# Patient Record
Sex: Female | Born: 1995 | Race: Black or African American | Hispanic: No | Marital: Single | Smoking: Never smoker
Health system: Southern US, Community
[De-identification: ages and names within clinical notes are randomized; demographics above are authoritative.]

## PROBLEM LIST (undated history)

## (undated) ENCOUNTER — Inpatient Hospital Stay (HOSPITAL_COMMUNITY): Payer: Self-pay

## (undated) DIAGNOSIS — N926 Irregular menstruation, unspecified: Secondary | ICD-10-CM

## (undated) DIAGNOSIS — F419 Anxiety disorder, unspecified: Secondary | ICD-10-CM

## (undated) DIAGNOSIS — T7840XA Allergy, unspecified, initial encounter: Secondary | ICD-10-CM

## (undated) DIAGNOSIS — J45909 Unspecified asthma, uncomplicated: Secondary | ICD-10-CM

## (undated) HISTORY — DX: Anxiety disorder, unspecified: F41.9

## (undated) HISTORY — DX: Allergy, unspecified, initial encounter: T78.40XA

## (undated) HISTORY — DX: Irregular menstruation, unspecified: N92.6

---
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
## 2017-03-30 IMAGING — CR DG CHEST 2V
2 series · 2 of 2 positions shown · non-contrast
Comparison: 02/08/2015

CLINICAL DATA: Chest pain vomiting

EXAM:
CHEST  2 VIEW

[chest pa]
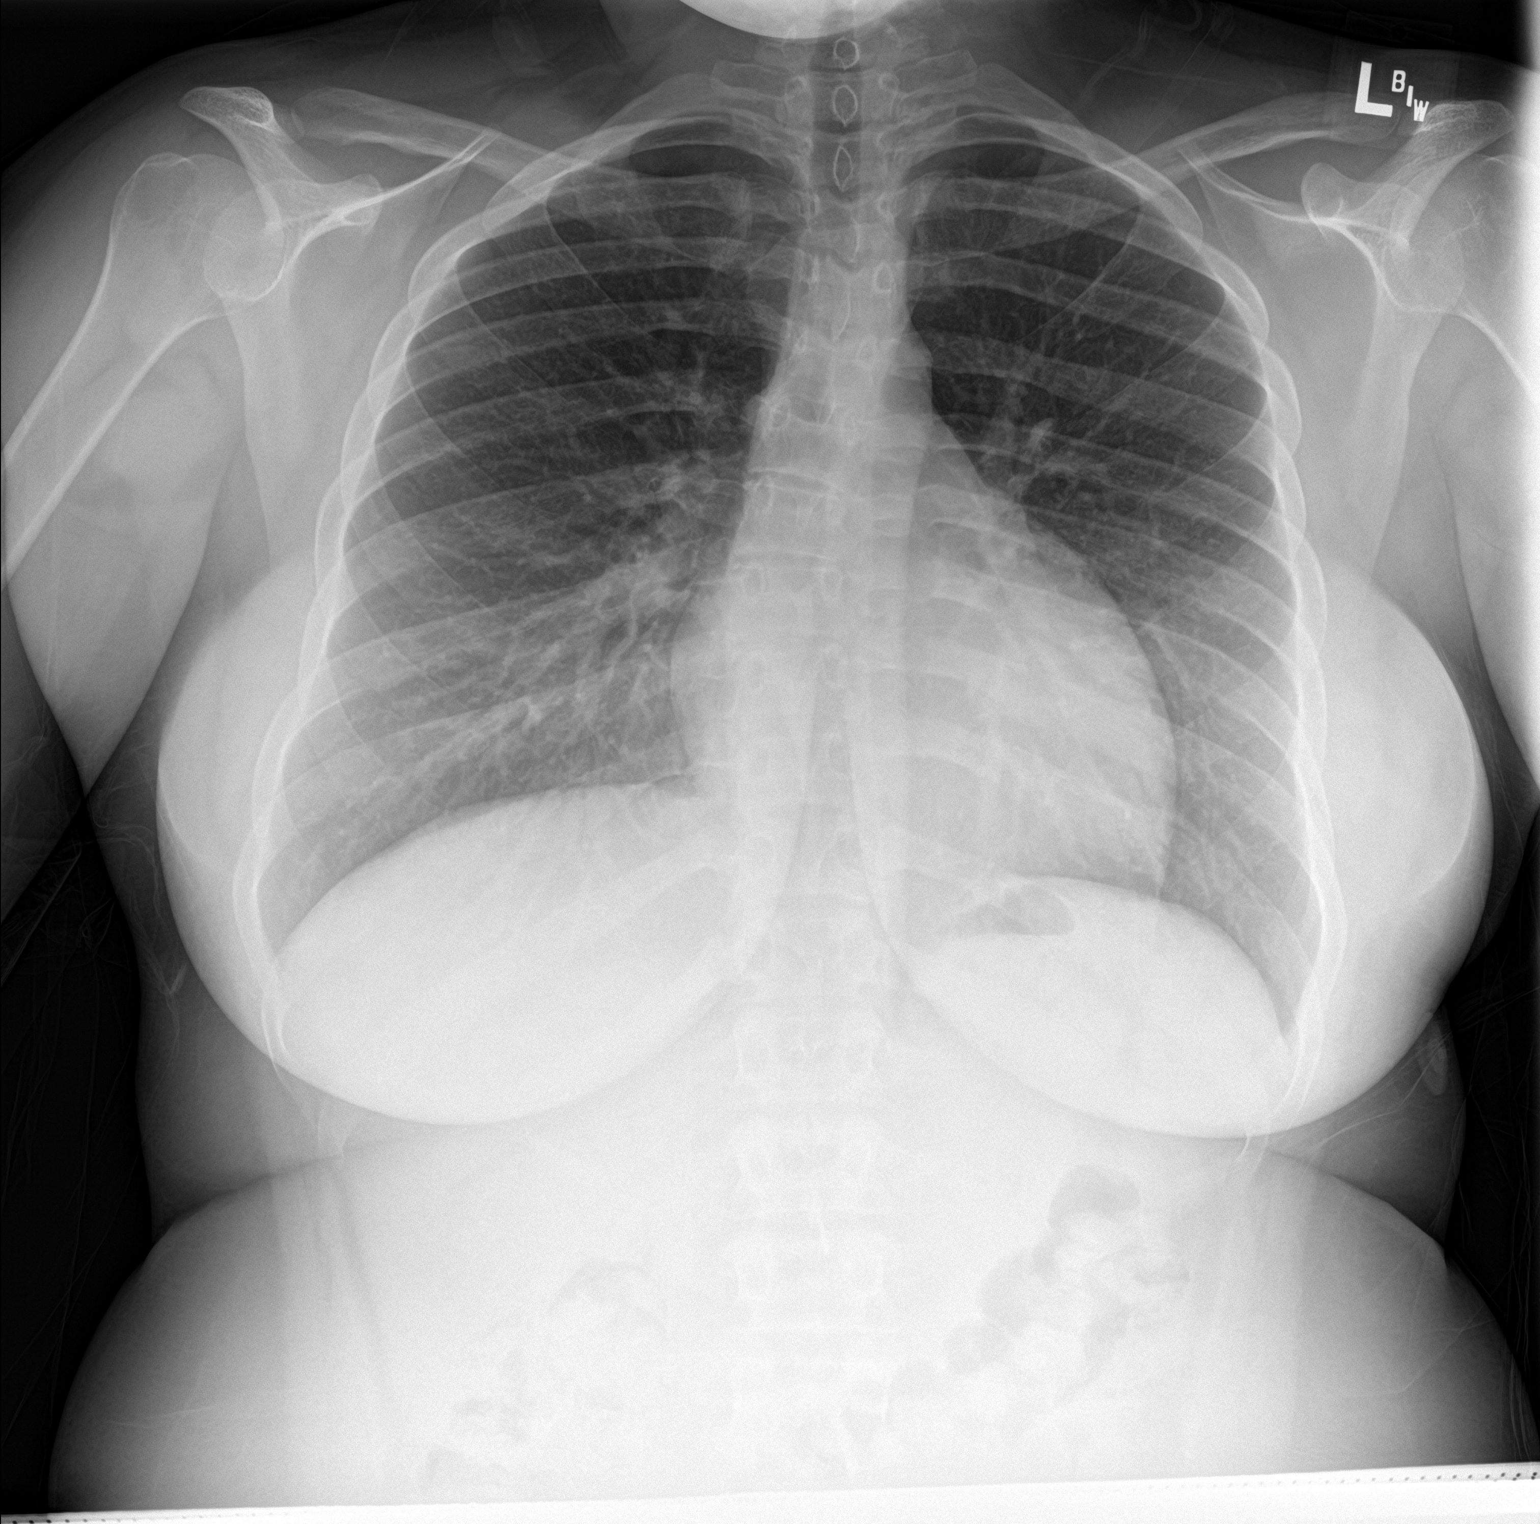

[chest lat]
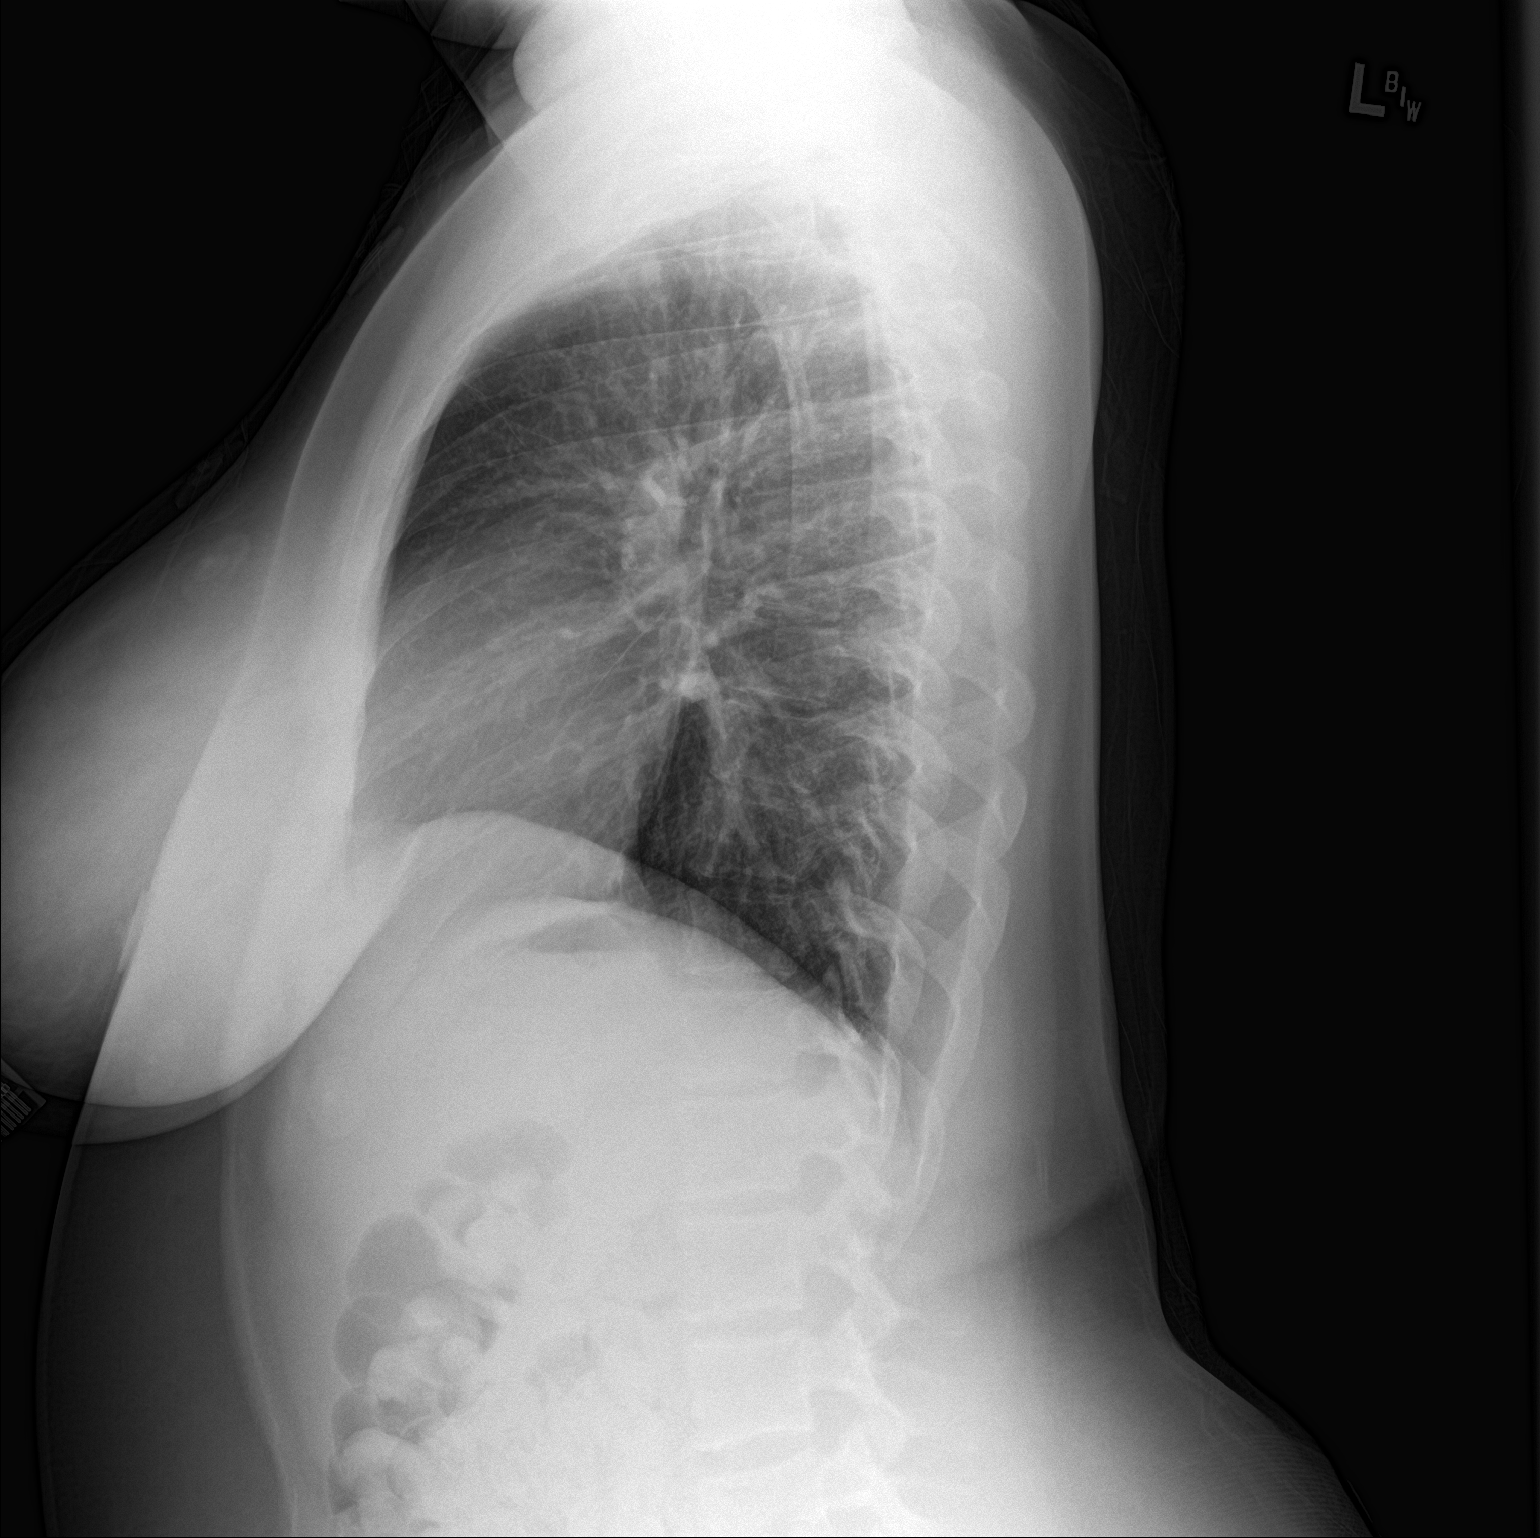

[2 of 2 positions shown; findings below may reference images not displayed]

FINDINGS: The heart size and mediastinal contours are within normal limits.
Both lungs are clear. The visualized skeletal structures are
unremarkable.
IMPRESSION: No active cardiopulmonary disease.

## 2017-10-15 ENCOUNTER — Emergency Department (HOSPITAL_COMMUNITY)
Admission: EM | Admit: 2017-10-15 | Discharge: 2017-10-15 | Disposition: A | Discharge status: Home / Self Care | Payer: Medicaid - Out of State | Attending: Emergency Medicine | Admitting: Emergency Medicine

## 2017-10-15 ENCOUNTER — Encounter (HOSPITAL_COMMUNITY): Payer: Self-pay | Admitting: Emergency Medicine

## 2017-10-15 ENCOUNTER — Other Ambulatory Visit: Payer: Self-pay

## 2017-10-15 DIAGNOSIS — R070 Pain in throat: Secondary | ICD-10-CM | POA: Insufficient documentation

## 2017-10-15 DIAGNOSIS — R0981 Nasal congestion: Secondary | ICD-10-CM | POA: Diagnosis not present

## 2017-10-15 DIAGNOSIS — J45909 Unspecified asthma, uncomplicated: Secondary | ICD-10-CM | POA: Diagnosis not present

## 2017-10-15 DIAGNOSIS — Z7722 Contact with and (suspected) exposure to environmental tobacco smoke (acute) (chronic): Secondary | ICD-10-CM | POA: Diagnosis not present

## 2017-10-15 DIAGNOSIS — R51 Headache: Secondary | ICD-10-CM | POA: Insufficient documentation

## 2017-10-15 DIAGNOSIS — Z79899 Other long term (current) drug therapy: Secondary | ICD-10-CM | POA: Insufficient documentation

## 2017-10-15 LAB — GROUP A STREP BY PCR: Group A Strep by PCR: NOT DETECTED

## 2017-10-15 MED ORDER — FLUTICASONE PROPIONATE 50 MCG/ACT NA SUSP: 1.0000 | Freq: Every day | NASAL | 0 refills | Status: DC

## 2017-10-15 MED ORDER — CETIRIZINE HCL 10 MG PO TABS: 10.0000 mg | ORAL_TABLET | Freq: Every day | ORAL | 0 refills | Status: DC

## 2017-10-15 NOTE — Discharge Instructions (Addendum)
You are seen in the emergency department today for congestion, sore throat, and cough.  Your strep test was negative.  This is likely due to allergies as discussed.  Please use Flonase, this is an intranasal steroid, daily.  Additionally please take Zyrtec daily as this is an allergy medicine.  We have prescribed you new medication(s) today. Discuss the medications prescribed today with your pharmacist as they can have adverse effects and interactions with your other medicines including over the counter and prescribed medications. Seek medical evaluation if you start to experience new or abnormal symptoms after taking one of these medicines, seek care immediately if you start to experience difficulty breathing, feeling of your throat closing, facial swelling, or rash as these could be indications of a more serious allergic reaction  Please stop using Afrin as this can actually resultantly make your congestion worse.  Follow-up with your primary care provider within 1 week for reevaluation.  Return to the ER for new or worsening symptoms or any other concerns that you may have.

## 2017-10-15 NOTE — ED Provider Notes (Signed)
Kellie Padilla [facility] EM[facility] Provider Note   CSN: [id number] Arrival date & time: 10/15/17  1048     History   Chief Complaint Chief Complaint  Patient presents with  . Facial Pain  . Sore Throat  . Nasal Congestion    HPI Kellie Padilla is a 22 y.o. female with a hx of asthma who presents to the ED with complaints of URI sxs x 1 week. Patient states she has had congestion, rhinorrhea, sinus pressure, bilateral ear pressure, and sore throat with swallowing (able to swallow). She has had minimal dry cough as well. She [place] and feels her allergies are flaring up. Tried Afrin nasal spray briefly without relief. No other specific alleviating/aggravating factors. Denies fever, chills, chest pain, wheezing, or dyspnea.   HPI  Past Medical History:  Diagnosis Date  . Asthma     There are no active problems to display for this patient.   History reviewed. No pertinent surgical history.   OB History   None      Home Medications    Prior to Admission medications   Medication Sig Start Date End Date Taking? Authorizing Provider  Acetaminophen-Pamabrom (MIDOL MAX ST TEEN FORMULA PO) Take 2 tablets by mouth daily as needed (for cramps).    [provider]  albuterol (PROVENTIL HFA;VENTOLIN HFA) 108 (90 BASE) MCG/ACT inhaler Inhale 1-2 puffs into the lungs every 6 (six) hours as needed for wheezing or shortness of breath. 02/08/15   Elson AreasSofia, Leslie K, PA-C  Aspirin-Salicylamide-Caffeine (BC HEADACHE POWDER PO) Take 1 Package by mouth daily as needed (for pain).    [provider]  benzonatate (TESSALON) 100 MG capsule Take 1 capsule (100 mg total) by mouth every 8 (eight) hours. 09/05/16   Janne NapoleonNeese, Hope M, NP  fluconazole (DIFLUCAN) 150 MG tablet Take 1 tablet (150 mg total) by mouth once. 12/28/12   Santiago GladLaisure, Heather, PA-C  ondansetron (ZOFRAN-ODT) 4 MG disintegrating tablet Take 1 tablet (4 mg total) by mouth every 6  (six) hours as needed for nausea or vomiting. 04/03/13   Lowanda FosterBrewer, Mindy, NP  pseudoephedrine (SUDAFED) 30 MG tablet Take 1 tablet (30 mg total) by mouth every 6 (six) hours as needed for congestion. 09/05/16   Janne NapoleonNeese, Hope M, NP  sodium chloride (OCEAN) 0.65 % SOLN nasal spray Place 1 spray into both nostrils as needed for congestion. 09/05/16   Janne NapoleonNeese, Hope M, NP    Family History No family history on file.  Social History Social History   Tobacco Use  . Smoking status: Passive Smoke Exposure - Never Smoker  . Smokeless tobacco: Never Used  Substance Use Topics  . Alcohol use: No  . Drug use: No     Allergies   Shellfish allergy   Review of Systems Review of Systems  Constitutional: Negative for chills and fever.  HENT: Positive for congestion, rhinorrhea, sinus pressure and sore throat. Negative for ear pain.   Respiratory: Positive for cough. Negative for shortness of breath and wheezing.   Cardiovascular: Negative for chest pain.  Gastrointestinal: Negative for abdominal pain.    Physical Exam Updated Vital Signs BP 118/75 (BP Location: Right Arm)   Pulse 78   Temp 98.1 F (36.7 C) (Oral)   Resp 16   Ht 4\' 11"  (1.499 m)   LMP 10/03/2017   SpO2 100%   BMI 32.32 kg/m   Physical Exam  Constitutional: She appears well-developed and well-nourished.  Non-toxic appearance. No distress.  HENT:  Head: Normocephalic and atraumatic.  Right Ear: Tympanic membrane, external ear and ear canal normal. No mastoid tenderness. Tympanic membrane is not perforated, not erythematous, not retracted and not bulging.  Left Ear: Tympanic membrane, external ear and ear canal normal. No mastoid tenderness. Tympanic membrane is not perforated, not erythematous, not retracted and not bulging.  Nose: Mucosal edema (somewhat boggy turbinates) present. Right sinus exhibits no maxillary sinus tenderness and no frontal sinus tenderness. Left sinus exhibits no maxillary sinus tenderness and no  frontal sinus tenderness.  Mouth/Throat: Uvula is midline. Posterior oropharyngeal erythema present. No oropharyngeal exudate.  Patient tolerating her own secretions without difficulty. No trismus. No drooling. No hot potato voice. Submandibular compartment is soft.   Eyes: Pupils are equal, round, and reactive to light. Conjunctivae are normal. Right eye exhibits no discharge. Left eye exhibits no discharge.  Neck: Normal range of motion. Neck supple. No neck rigidity.  Cardiovascular: Normal rate and regular rhythm.  No murmur heard. Pulmonary/Chest: Effort normal and breath sounds normal. No respiratory distress. She has no wheezes. She has no rhonchi. She has no rales.  Abdominal: Soft. She exhibits no distension. There is no tenderness.  Lymphadenopathy:    She has no cervical adenopathy.  Neurological: She is alert.  Skin: Skin is warm and dry. No rash noted.  Psychiatric: She has a normal mood and affect. Her behavior is normal.  Nursing note and vitals reviewed.    ED Treatments / Results  Labs Results for orders placed or performed during the hospital encounter of 10/15/17  Group A Strep by PCR  Result Value Ref Range   Group A Strep by PCR NOT DETECTED NOT DETECTED   No results found. EKG None  Radiology No results found.  Procedures Procedures (including critical care time)  Medications Ordered in ED Medications - No data to display   Initial Impression / Assessment and Plan / ED Course  I have reviewed the triage vital signs and the nursing notes.  Pertinent labs & imaging results that were available during my care of the patient were reviewed by me and considered in my medical decision making (see chart for details).   Patient presents to the ER with URI symptoms for the past 1 week.  Patient nontoxic-appearing, in no apparent distress, vitals WNL.  Patient's lungs are clear to auscultation bilaterally, no respiratory distress, afebrile, doubt pneumonia.  No  wheezing to raise concern for asthma exacerbation.  Patient is afebrile, no sinus tenderness, low suspicion for acute bacterial sinusitis.  Strep test negative.  No meningeal signs.  No evidence of acute AOM or EOM on exam.  Suspect viral versus allergic, will treat supportively with Flonase and Zyrtec. I discussed results, treatment plan, need for PCP follow-up, and return precautions with the patient. Provided opportunity for questions, patient confirmed understanding and is in agreement with plan.    Final Clinical Impressions(s) / ED Diagnoses   Final diagnoses:  Nasal congestion    ED Discharge Orders        Ordered    fluticasone (FLONASE) 50 MCG/ACT nasal spray  Daily     10/15/17 1304    cetirizine (ZYRTEC ALLERGY) 10 MG tablet  Daily     10/15/17 [address] Pleas Koch, PA-C 10/15/17 1715    Benjiman Core, MD 10/16/17 [phone]

## 2017-10-15 NOTE — ED Notes (Signed)
Pt states that she needs to leave because her ride is here.

## 2017-10-15 NOTE — ED Triage Notes (Signed)
Pt. Stated, Kellie Padilla had sinus problem and my throat hurts a little . This started a week ago.

## 2018-04-07 ENCOUNTER — Emergency Department (HOSPITAL_COMMUNITY): Payer: Medicaid - Out of State

## 2018-04-07 ENCOUNTER — Other Ambulatory Visit: Payer: Self-pay

## 2018-04-07 ENCOUNTER — Encounter (HOSPITAL_COMMUNITY): Payer: Self-pay | Admitting: Emergency Medicine

## 2018-04-07 ENCOUNTER — Emergency Department (HOSPITAL_COMMUNITY)
Admission: EM | Admit: 2018-04-07 | Discharge: 2018-04-07 | Disposition: A | Discharge status: Home / Self Care | Payer: Medicaid - Out of State | Attending: Emergency Medicine | Admitting: Emergency Medicine

## 2018-04-07 DIAGNOSIS — R0602 Shortness of breath: Secondary | ICD-10-CM | POA: Diagnosis present

## 2018-04-07 DIAGNOSIS — J45909 Unspecified asthma, uncomplicated: Secondary | ICD-10-CM | POA: Diagnosis not present

## 2018-04-07 DIAGNOSIS — Z79899 Other long term (current) drug therapy: Secondary | ICD-10-CM | POA: Diagnosis not present

## 2018-04-07 DIAGNOSIS — J9801 Acute bronchospasm: Secondary | ICD-10-CM | POA: Diagnosis not present

## 2018-04-07 DIAGNOSIS — Z7722 Contact with and (suspected) exposure to environmental tobacco smoke (acute) (chronic): Secondary | ICD-10-CM | POA: Insufficient documentation

## 2018-04-07 LAB — I-STAT BETA HCG BLOOD, ED (MC, WL, AP ONLY): I-stat hCG, quantitative: <5 m[IU]/mL (ref <5)

## 2018-04-07 LAB — CBC
HEMATOCRIT: 40.7 % (ref 36.0–46.0)
Hemoglobin: 12.6 g/dL (ref 12.0–15.0)
MCH: 27.3 pg (ref 26.0–34.0)
MCHC: 31 g/dL (ref 30.0–36.0)
MCV: 88.3 fL (ref 80.0–100.0)
Platelets: 264 10*3/uL (ref 150–400)
RBC: 4.61 MIL/uL (ref 3.87–5.11)
RDW: 14.2 % (ref 11.5–15.5)
WBC: 5.5 10*3/uL (ref 4.0–10.5)
nRBC: 0 % (ref 0.0–0.2)

## 2018-04-07 LAB — BASIC METABOLIC PANEL
Anion gap: 8 (ref 5–15)
BUN: 17 mg/dL (ref 6–20)
CO2: 24 mmol/L (ref 22–32)
Calcium: 9.1 mg/dL (ref 8.9–10.3)
Chloride: 107 mmol/L (ref 98–111)
Creatinine, Ser: 0.66 mg/dL (ref 0.44–1.00)
GFR calc Af Amer: >60 mL/min (ref >60)
GLUCOSE: 99 mg/dL (ref 70–99)
POTASSIUM: 3.6 mmol/L (ref 3.5–5.1)
Sodium: 139 mmol/L (ref 135–145)

## 2018-04-07 LAB — I-STAT TROPONIN, ED: TROPONIN I, POC: 0 ng/mL (ref 0.00–0.08)

## 2018-04-07 MED ORDER — ALBUTEROL SULFATE (2.5 MG/3ML) 0.083% IN NEBU: 5.0000 mg | INHALATION_SOLUTION | Freq: Once | RESPIRATORY_TRACT | Status: DC

## 2018-04-07 MED ORDER — AEROCHAMBER PLUS FLO-VU MISC
1.0000 | Freq: Once | Status: DC
  Filled 2018-04-07: qty 1

## 2018-04-07 MED ORDER — ALBUTEROL SULFATE HFA 108 (90 BASE) MCG/ACT IN AERS: 2.0000 | INHALATION_SPRAY | RESPIRATORY_TRACT | Status: DC | PRN

## 2018-04-07 MED ORDER — ALBUTEROL SULFATE HFA 108 (90 BASE) MCG/ACT IN AERS
2.0000 | INHALATION_SPRAY | RESPIRATORY_TRACT | Status: DC | PRN
  Filled 2018-04-07: qty 6.7

## 2018-04-07 MED ORDER — AEROCHAMBER Z-STAT PLUS/MEDIUM MISC
Status: AC
  Filled 2018-04-07: qty 1

## 2018-04-07 MED ORDER — IPRATROPIUM-ALBUTEROL 0.5-2.5 (3) MG/3ML IN SOLN
3.0000 mL | Freq: Once | RESPIRATORY_TRACT | Status: AC
  Administered 2018-04-07: 3 mL via RESPIRATORY_TRACT
  Filled 2018-04-07: qty 3

## 2018-04-07 NOTE — ED Triage Notes (Signed)
Patient complaining of sob and left chest pain. Patient states she woke up this morning and she was having problems with her breathing.

## 2018-04-07 NOTE — ED Provider Notes (Signed)
[facility]-EMERGENCY DEPT Provider Note: Lowella DellJ. Lane Trishia Cuthrell, MD, FACEP  CSN: [id number] MRN: [id number] ARRIVAL: 04/07/18 at 0248 ROOM: WA[facility]   CHIEF COMPLAINT  Shortness of Breath   HISTORY OF PRESENT ILLNESS  04/07/18 4:29 AM Kellie Padilla is a [age] y.o. female with a history of asthma.  She woke up this morning from sleep with coughing and chest tightness.  Symptoms are consistent with previous asthma attacks.  She has had this occur several days in a row.  She does not have an inhaler.  She was given a DuoNeb treatment prior to my evaluation with significant improvement in her symptoms.  She denies frank chest pain.   Past Medical History:  Diagnosis Date  . Asthma     History reviewed. No pertinent surgical history.  History reviewed. No pertinent family history.  Social History   Tobacco Use  . Smoking status: Passive Smoke Exposure - Never Smoker  . Smokeless tobacco: Never Used  Substance Use Topics  . Alcohol use: No  . Drug use: No    Prior to Admission medications   Medication Sig Start Date End Date Taking? Authorizing Provider  acetaminophen (TYLENOL) 325 MG tablet Take 650 mg by mouth every 6 (six) hours as needed for moderate pain.   Yes [provider]  cetirizine (ZYRTEC ALLERGY) 10 MG tablet Take 1 tablet (10 mg total) by mouth daily. Patient taking differently: Take 10 mg by mouth daily as needed for allergies.  10/15/17  Yes Petrucelli, Samantha R, PA-C  hydroxypropyl methylcellulose / hypromellose (ISOPTO TEARS / GONIOVISC) 2.5 % ophthalmic solution Place 1 drop into both eyes 3 (three) times daily as needed for dry eyes.   Yes [provider]  albuterol (PROVENTIL HFA;VENTOLIN HFA) 108 (90 BASE) MCG/ACT inhaler Inhale 1-2 puffs into the lungs every 6 (six) hours as needed for wheezing or shortness of breath. 02/08/15   Elson AreasSofia, Leslie K, PA-C  benzonatate (TESSALON) 100 MG capsule Take 1 capsule (100 mg total) by mouth every 8 (eight)  hours. Patient not taking: Reported on 04/07/2018 09/05/16   Janne NapoleonNeese, Hope M, NP  fluconazole (DIFLUCAN) 150 MG tablet Take 1 tablet (150 mg total) by mouth once. Patient not taking: Reported on 04/07/2018 12/28/12   Santiago GladLaisure, Heather, PA-C  fluticasone (FLONASE) 50 MCG/ACT nasal spray Place 1 spray into both nostrils daily. Patient not taking: Reported on 04/07/2018 10/15/17   Petrucelli, Lelon MastSamantha R, PA-C  ondansetron (ZOFRAN-ODT) 4 MG disintegrating tablet Take 1 tablet (4 mg total) by mouth every 6 (six) hours as needed for nausea or vomiting. Patient not taking: Reported on 04/07/2018 04/03/13   Lowanda FosterBrewer, Mindy, NP  pseudoephedrine (SUDAFED) 30 MG tablet Take 1 tablet (30 mg total) by mouth every 6 (six) hours as needed for congestion. Patient not taking: Reported on 04/07/2018 09/05/16   Janne NapoleonNeese, Hope M, NP  sodium chloride (OCEAN) 0.65 % SOLN nasal spray Place 1 spray into both nostrils as needed for congestion. Patient not taking: Reported on 04/07/2018 09/05/16   Janne NapoleonNeese, Hope M, NP    Allergies Dust mite extract; Pollen extract; and Shellfish allergy   REVIEW OF SYSTEMS  Negative except as noted here or in the History of Present Illness.   PHYSICAL EXAMINATION  Initial Vital Signs Blood pressure 118/76, pulse 75, temperature 98.4 F (36.9 C), temperature source Oral, resp. rate 19, height 4\' 11"  (1.499 m), weight 79.4 kg, last menstrual period 04/01/2018, SpO2 100 %.  Examination General: Well-developed, well-nourished female in no acute distress; appearance consistent with  age of record HENT: normocephalic; atraumatic Eyes: pupils equal, round and reactive to light; extraocular muscles intact Neck: supple Heart: regular rate and rhythm Lungs: clear to auscultation bilaterally except diminished sounds in right base Abdomen: soft; nondistended; nontender; no masses or hepatosplenomegaly; bowel sounds present Extremities: No deformity; full range of motion; pulses normal Neurologic:  Awake, alert and oriented; motor function intact in all extremities and symmetric; no facial droop Skin: Warm and dry Psychiatric: Normal mood and affect   RESULTS  Summary of this visit's results, reviewed by myself:   EKG Interpretation  Date/Time:  Monday April 07 2018 03:32:14 EST Ventricular Rate:  75 PR Interval:    QRS Duration: 98 QT Interval:  382 QTC Calculation: 427 R Axis:   50 Text Interpretation:  Sinus rhythm Normal ECG No significant change was found Confirmed by Paula Libra ([id number]) on 04/07/2018 3:59:44 AM      Laboratory Studies: Results for orders placed or performed during the hospital encounter of 04/07/18 (from the past 24 hour(s))  Basic metabolic panel     Status: None   Collection Time: 04/07/18  3:20 AM  Result Value Ref Range   Sodium 139 135 - 145 mmol/L   Potassium 3.6 3.5 - 5.1 mmol/L   Chloride 107 98 - 111 mmol/L   CO2 24 22 - 32 mmol/L   Glucose, Bld 99 70 - 99 mg/dL   BUN 17 6 - 20 mg/dL   Creatinine, Ser 6.21 0.44 - 1.00 mg/dL   Calcium 9.1 8.9 - 30.8 mg/dL   GFR calc non Af Amer >60 >60 mL/min   GFR calc Af Amer >60 >60 mL/min   Anion gap 8 5 - 15  CBC     Status: None   Collection Time: 04/07/18  3:20 AM  Result Value Ref Range   WBC 5.5 4.0 - 10.5 K/uL   RBC 4.61 3.87 - 5.11 MIL/uL   Hemoglobin 12.6 12.0 - 15.0 g/dL   HCT 65.7 84.6 - 96.2 %   MCV 88.3 80.0 - 100.0 fL   MCH 27.3 26.0 - 34.0 pg   MCHC 31.0 30.0 - 36.0 g/dL   RDW 95.2 84.1 - 32.4 %   Platelets 264 150 - 400 K/uL   nRBC 0.0 0.0 - 0.2 %  I-stat troponin, ED     Status: None   Collection Time: 04/07/18  3:47 AM  Result Value Ref Range   Troponin i, poc 0.00 0.00 - 0.08 ng/mL   Comment 3          I-Stat beta hCG blood, ED     Status: None   Collection Time: 04/07/18  3:48 AM  Result Value Ref Range   I-stat hCG, quantitative <5.0 <5 mIU/mL   Comment 3           Imaging Studies: Dg Chest 2 View  Result Date: 04/07/2018 CLINICAL DATA:  Shortness of  breath EXAM: CHEST - 2 VIEW COMPARISON:  09/05/2016 FINDINGS: The heart size and mediastinal contours are within normal limits. Both lungs are clear. The visualized skeletal structures are unremarkable. IMPRESSION: No active cardiopulmonary disease. Electronically Signed   By: Deatra Robinson M.D.   On: 04/07/2018 04:27    ED COURSE and MDM  Nursing notes and initial vitals signs, including pulse oximetry, reviewed.  Vitals:   04/07/18 0321  BP: 118/76  Pulse: 75  Resp: 19  Temp: 98.4 F (36.9 C)  TempSrc: Oral  SpO2: 100%  Weight: 79.4 kg  Height:  4\' 11"  (1.499 m)   Will provide patient with a new inhaler and AeroChamber.  PROCEDURES    ED DIAGNOSES     ICD-10-CM   1. Acute bronchospasm J98.01        Rushi Chasen, Jonny RuizJohn, MD 04/07/18 [phone]

## 2018-04-07 NOTE — ED Notes (Signed)
Pt is in no respiratory distress at this time.

## 2019-12-21 ENCOUNTER — Encounter (HOSPITAL_COMMUNITY): Payer: Self-pay | Admitting: Emergency Medicine

## 2019-12-21 ENCOUNTER — Ambulatory Visit (HOSPITAL_COMMUNITY)
Admission: EM | Admit: 2019-12-21 | Discharge: 2019-12-21 | Disposition: A | Discharge status: Home / Self Care | Payer: PRIVATE HEALTH INSURANCE | Attending: Family Medicine | Admitting: Family Medicine

## 2019-12-21 ENCOUNTER — Other Ambulatory Visit: Payer: Self-pay

## 2019-12-21 DIAGNOSIS — R519 Headache, unspecified: Secondary | ICD-10-CM | POA: Insufficient documentation

## 2019-12-21 DIAGNOSIS — R112 Nausea with vomiting, unspecified: Secondary | ICD-10-CM

## 2019-12-21 DIAGNOSIS — Z20822 Contact with and (suspected) exposure to covid-19: Secondary | ICD-10-CM | POA: Insufficient documentation

## 2019-12-21 DIAGNOSIS — Z7722 Contact with and (suspected) exposure to environmental tobacco smoke (acute) (chronic): Secondary | ICD-10-CM | POA: Insufficient documentation

## 2019-12-21 DIAGNOSIS — Z79899 Other long term (current) drug therapy: Secondary | ICD-10-CM | POA: Insufficient documentation

## 2019-12-21 LAB — SARS CORONAVIRUS 2 (TAT 6-24 HRS): SARS Coronavirus 2: NEGATIVE

## 2019-12-21 LAB — HCG, QUANTITATIVE, PREGNANCY: hCG, Beta Chain, Quant, S: <1 m[IU]/mL (ref <5)

## 2019-12-21 NOTE — ED Triage Notes (Signed)
Pt presents with vomiting and headache xs 3 days. Pt states she is late on her cycle and has taken two at home pregnancy test that were negative but would like blood test done.

## 2019-12-21 NOTE — Discharge Instructions (Addendum)
Covid swab pending.  I will call you with your blood test results.  Rest, hydrate  Follow up as needed for continued or worsening symptoms

## 2019-12-22 NOTE — ED Provider Notes (Signed)
[facility]    CSN: [id number] Arrival date & time: 12/21/19  1014      History   Chief Complaint Chief Complaint  Patient presents with   Emesis    HPI Kellie Padilla is a 24 y.o. female.   Pt is a 24 year old female that presents with vomiting and headache x 3 days. Pt reporting that she is late on her menstrual cycle and concerned for pregnancy. She has had 2 negative pregnancy tests. Vomiting mostly in the am and after eating. Gagging with brushing teeth. Patient's last menstrual period was 11/19/2019. No abdominal pain, fever, diarrhea. No respiratory symptoms. No dizziness, blurred vision.      Past Medical History:  Diagnosis Date   Asthma     There are no problems to display for this patient.   History reviewed. No pertinent surgical history.  OB History   No obstetric history on file.      Home Medications    Prior to Admission medications   Medication Sig Start Date End Date Taking? Authorizing Provider  acetaminophen (TYLENOL) 325 MG tablet Take 650 mg by mouth every 6 (six) hours as needed for moderate pain.    [provider]  cetirizine (ZYRTEC ALLERGY) 10 MG tablet Take 1 tablet (10 mg total) by mouth daily. Patient taking differently: Take 10 mg by mouth daily as needed for allergies.  10/15/17   Petrucelli, Samantha R, PA-C  hydroxypropyl methylcellulose / hypromellose (ISOPTO TEARS / GONIOVISC) 2.5 % ophthalmic solution Place 1 drop into both eyes 3 (three) times daily as needed for dry eyes.    [provider]    Family History Family History  Problem Relation Age of Onset   Healthy Mother    Healthy Father     Social History Social History   Tobacco Use   Smoking status: Passive Smoke Exposure - Never Smoker   Smokeless tobacco: Never Used  Substance Use Topics   Alcohol use: No   Drug use: No     Allergies   Dust mite extract, Pollen extract, and Shellfish allergy   Review of Systems Review  of Systems   Physical Exam Triage Vital Signs ED Triage Vitals  Enc Vitals Group     BP 12/21/19 1240 114/69     Pulse Rate 12/21/19 1240 77     Resp 12/21/19 1240 19     Temp 12/21/19 1240 98.1 F (36.7 C)     Temp Source 12/21/19 1240 Oral     SpO2 12/21/19 1240 100 %     Weight --      Height --      Head Circumference --      Peak Flow --      Pain Score 12/21/19 1237 0     Pain Loc --      Pain Edu? --      Excl. in GC? --    No data found.  Updated Vital Signs BP 114/69 (BP Location: Left Arm)    Pulse 77    Temp 98.1 F (36.7 C) (Oral)    Resp 19    LMP 11/19/2019    SpO2 100%   Visual Acuity Right Eye Distance:   Left Eye Distance:   Bilateral Distance:    Right Eye Near:   Left Eye Near:    Bilateral Near:     Physical Exam Vitals and nursing note reviewed.  Constitutional:      General: She is not in  acute distress.    Appearance: Normal appearance. She is not ill-appearing, toxic-appearing or diaphoretic.  HENT:     Head: Normocephalic.     Nose: Nose normal.  Eyes:     Conjunctiva/sclera: Conjunctivae normal.  Pulmonary:     Effort: Pulmonary effort is normal.  Musculoskeletal:        General: Normal range of motion.     Cervical back: Normal range of motion.  Skin:    General: Skin is warm and dry.     Findings: No rash.  Neurological:     Mental Status: She is alert.  Psychiatric:        Mood and Affect: Mood normal.      UC Treatments / Results  Labs (all labs ordered are listed, but only abnormal results are displayed) Labs Reviewed  SARS CORONAVIRUS 2 (TAT 6-24 HRS)  HCG, QUANTITATIVE, PREGNANCY    EKG   Radiology No results found.  Procedures Procedures (including critical care time)  Medications Ordered in UC Medications - No data to display  Initial Impression / Assessment and Plan / UC Course  I have reviewed the triage vital signs and the nursing notes.  Pertinent labs & imaging results that were available  during my care of the patient were reviewed by me and considered in my medical decision making (see chart for details).     Nausea and vomiting Hcg drawn covid swab pending.  Rest, hydrate.  Follow up as needed for continued or worsening symptoms  Final Clinical Impressions(s) / UC Diagnoses   Final diagnoses:  Non-intractable vomiting with nausea, unspecified vomiting type     Discharge Instructions     Covid swab pending.  I will call you with your blood test results.  Rest, hydrate  Follow up as needed for continued or worsening symptoms      ED Prescriptions    None     PDMP not reviewed this encounter.   Dahlia Byes A, NP 12/22/19 1457

## 2020-06-21 ENCOUNTER — Emergency Department (HOSPITAL_COMMUNITY)
Admission: EM | Admit: 2020-06-21 | Discharge: 2020-06-22 | Disposition: A | Discharge status: Home / Self Care | Payer: 59 | Attending: Emergency Medicine | Admitting: Emergency Medicine

## 2020-06-21 ENCOUNTER — Encounter (HOSPITAL_COMMUNITY): Payer: Self-pay

## 2020-06-21 DIAGNOSIS — N926 Irregular menstruation, unspecified: Secondary | ICD-10-CM | POA: Insufficient documentation

## 2020-06-21 DIAGNOSIS — J4521 Mild intermittent asthma with (acute) exacerbation: Secondary | ICD-10-CM | POA: Insufficient documentation

## 2020-06-21 DIAGNOSIS — R1110 Vomiting, unspecified: Secondary | ICD-10-CM | POA: Diagnosis not present

## 2020-06-21 DIAGNOSIS — Z7722 Contact with and (suspected) exposure to environmental tobacco smoke (acute) (chronic): Secondary | ICD-10-CM | POA: Insufficient documentation

## 2020-06-21 DIAGNOSIS — R0602 Shortness of breath: Secondary | ICD-10-CM | POA: Diagnosis present

## 2020-06-21 LAB — CBC
HCT: 40.3 % (ref 36.0–46.0)
Hemoglobin: 12.8 g/dL (ref 12.0–15.0)
MCH: 28.8 pg (ref 26.0–34.0)
MCHC: 31.8 g/dL (ref 30.0–36.0)
MCV: 90.6 fL (ref 80.0–100.0)
Platelets: 243 10*3/uL (ref 150–400)
RBC: 4.45 MIL/uL (ref 3.87–5.11)
RDW: 13.2 % (ref 11.5–15.5)
WBC: 5.5 10*3/uL (ref 4.0–10.5)
nRBC: 0 % (ref 0.0–0.2)

## 2020-06-21 LAB — I-STAT BETA HCG BLOOD, ED (MC, WL, AP ONLY): I-stat hCG, quantitative: <5 m[IU]/mL (ref <5)

## 2020-06-21 MED ORDER — ALBUTEROL SULFATE HFA 108 (90 BASE) MCG/ACT IN AERS
2.0000 | INHALATION_SPRAY | RESPIRATORY_TRACT | Status: DC | PRN
  Filled 2020-06-21: qty 6.7

## 2020-06-21 NOTE — ED Triage Notes (Signed)
Pt reports starting two nights she notice she's been gasping for air out of her sleep and does not have an inhaler nor an PCP and wants to be evaluated due to her hx of Asthma But denies any SOB at this time but only during exertion and Pt reports 1 episode  vomited today, Pt states that a trigger for asthma. Pt also reports she has not gotten her period and is late 12 days and wants to be evaluated

## 2020-06-22 LAB — COMPREHENSIVE METABOLIC PANEL WITH GFR
ALT: 19 U/L (ref 0–44)
AST: 20 U/L (ref 15–41)
Albumin: 3.9 g/dL (ref 3.5–5.0)
Alkaline Phosphatase: 47 U/L (ref 38–126)
Anion gap: 7 (ref 5–15)
BUN: 18 mg/dL (ref 6–20)
CO2: 23 mmol/L (ref 22–32)
Calcium: 9 mg/dL (ref 8.9–10.3)
Chloride: 106 mmol/L (ref 98–111)
Creatinine, Ser: 0.63 mg/dL (ref 0.44–1.00)
GFR, Estimated: >60 mL/min
Glucose, Bld: 101 mg/dL — ABNORMAL HIGH (ref 70–99)
Potassium: 3.5 mmol/L (ref 3.5–5.1)
Sodium: 136 mmol/L (ref 135–145)
Total Bilirubin: 0.6 mg/dL (ref 0.3–1.2)
Total Protein: 7.2 g/dL (ref 6.5–8.1)

## 2020-06-22 MED ORDER — AEROCHAMBER PLUS FLO-VU MISC: 2 refills | Status: DC

## 2020-06-22 MED ORDER — ALBUTEROL SULFATE HFA 108 (90 BASE) MCG/ACT IN AERS: 2.0000 | INHALATION_SPRAY | RESPIRATORY_TRACT | 0 refills | Status: DC | PRN

## 2020-06-22 MED ORDER — ALBUTEROL SULFATE (2.5 MG/3ML) 0.083% IN NEBU: 2.5000 mg | INHALATION_SOLUTION | Freq: Four times a day (QID) | RESPIRATORY_TRACT | 12 refills | Status: DC | PRN

## 2020-06-22 NOTE — Discharge Instructions (Addendum)
1. Medications: albuterol - MDI and nebulizer to be used as needed for shortness of breath and wheezing, continue taking Zyrtec daily, usual home medications 2. Treatment: rest, drink plenty of fluids, begin OTC antihistamine (Zyrtec or Claritin)  3. Follow Up: Please followup with your primary doctor in 2-3 days for discussion of your diagnoses and further evaluation after today's visit; if you do not have a primary care doctor use the resource guide provided to find one; Please return to the ER for difficulty breathing, high fevers or worsening symptoms.

## 2020-06-22 NOTE — ED Provider Notes (Signed)
[facility] EMERGENCY DEPARTMENT Provider Note   CSN: [id number] Arrival date & time: 06/21/20  2244     History Chief Complaint  Patient presents with  . Asthma    Kellie Padilla is a 25 y.o. female with a hx of asthma presents to the Emergency Department complaining of intermittent wheezing and shortness of breath over the last week and a half.  Patient reports she has a history of asthma, was hospitalized as a child but never intubated.  She reports she moved recently from [place] and does not have any medications nor a primary care provider.  She reports several times this week she has found herself coughing, wheezing and today had 1 episode of posttussive emesis.  Patient reports this is normal during the spring when she has intermittent asthma flares.  Patient does report she is taking Zyrtec 1 times per day without significant relief.  She reports changing in the weather and pollen aggravate her symptoms.  Albuterol MDI normally alleviates her symptoms however she does not have one at this time.  Additionally, patient reports a history of irregular menses but states that her menstrual cycle is 12 days late.  She is concerned that she might be pregnant.  She is not taking an oral contraceptive.  She is sexually active with 1 female.  Denies abdominal pain, vaginal bleeding, vaginal discharge.  She has not taken a home pregnancy test.  The history is provided by the patient and medical records. No language interpreter was used.       Past Medical History:  Diagnosis Date  . Asthma     There are no problems to display for this patient.   History reviewed. No pertinent surgical history.   OB History   No obstetric history on file.     Family History  Problem Relation Age of Onset  . Healthy Mother   . Healthy Father     Social History   Tobacco Use  . Smoking status: Passive Smoke Exposure - Never Smoker  . Smokeless tobacco: Never Used  Substance  Use Topics  . Alcohol use: No  . Drug use: No    Home Medications Prior to Admission medications   Medication Sig Start Date End Date Taking? Authorizing Provider  albuterol (PROVENTIL) (2.5 MG/3ML) 0.083% nebulizer solution Take 3 mLs (2.5 mg total) by nebulization every 6 (six) hours as needed for wheezing or shortness of breath. 06/22/20  Yes Yanis Larin, Dahlia Client, PA-C  albuterol (VENTOLIN HFA) 108 (90 Base) MCG/ACT inhaler Inhale 2 puffs into the lungs every 2 (two) hours as needed for wheezing or shortness of breath (cough). 06/22/20  Yes Anajulia Leyendecker, Dahlia Client, PA-C  Spacer/Aero-Holding Chambers (AEROCHAMBER PLUS WITH MASK) inhaler Use as instructed 06/22/20  Yes Shambhavi Salley, Dahlia Client, PA-C  acetaminophen (TYLENOL) 325 MG tablet Take 650 mg by mouth every 6 (six) hours as needed for moderate pain.    [provider]  cetirizine (ZYRTEC ALLERGY) 10 MG tablet Take 1 tablet (10 mg total) by mouth daily. Patient taking differently: Take 10 mg by mouth daily as needed for allergies.  10/15/17   Petrucelli, Samantha R, PA-C  hydroxypropyl methylcellulose / hypromellose (ISOPTO TEARS / GONIOVISC) 2.5 % ophthalmic solution Place 1 drop into both eyes 3 (three) times daily as needed for dry eyes.    [provider]    Allergies    Dust mite extract, Pollen extract, and Shellfish allergy  Review of Systems   Review of Systems  Constitutional: Negative for  appetite change, diaphoresis, fatigue, fever and unexpected weight change.  HENT: Negative for mouth sores.   Eyes: Negative for visual disturbance.  Respiratory: Positive for cough, chest tightness, shortness of breath and wheezing.   Cardiovascular: Negative for chest pain.  Gastrointestinal: Positive for vomiting. Negative for abdominal pain, constipation, diarrhea and nausea.  Endocrine: Negative for polydipsia, polyphagia and polyuria.  Genitourinary: Positive for menstrual problem. Negative for dysuria, frequency, hematuria  and urgency.  Musculoskeletal: Negative for back pain and neck stiffness.  Skin: Negative for rash.  Allergic/Immunologic: Negative for immunocompromised state.  Neurological: Negative for syncope, light-headedness and headaches.  Hematological: Does not bruise/bleed easily.  Psychiatric/Behavioral: Negative for sleep disturbance. The patient is not nervous/anxious.     Physical Exam Updated Vital Signs BP 112/76 (BP Location: Left Arm)   Pulse 79   Temp 98.3 F (36.8 C) (Oral)   Resp (!) 22   Ht 4\' 11"  (1.499 m)   Wt 73.9 kg   SpO2 100%   BMI 32.92 kg/m   Physical Exam Vitals and nursing note reviewed.  Constitutional:      General: She is not in acute distress.    Appearance: She is not diaphoretic.  HENT:     Head: Normocephalic.  Eyes:     General: No scleral icterus.    Conjunctiva/sclera: Conjunctivae normal.  Cardiovascular:     Rate and Rhythm: Normal rate and regular rhythm.     Pulses: Normal pulses.          Radial pulses are 2+ on the right side and 2+ on the left side.  Pulmonary:     Effort: Pulmonary effort is normal. No tachypnea, accessory muscle usage, prolonged expiration, respiratory distress or retractions.     Breath sounds: Normal breath sounds. No stridor. No wheezing or rhonchi.     Comments: Equal chest rise. No increased work of breathing. Clear and equal breath sounds Abdominal:     General: There is no distension.     Palpations: Abdomen is soft.     Tenderness: There is no abdominal tenderness. There is no guarding or rebound.  Musculoskeletal:     Cervical back: Normal range of motion.     Comments: Moves all extremities equally and without difficulty.  Skin:    General: Skin is warm and dry.     Capillary Refill: Capillary refill takes less than 2 seconds.  Neurological:     Mental Status: She is alert.     GCS: GCS eye subscore is 4. GCS verbal subscore is 5. GCS motor subscore is 6.     Comments: Speech is clear and goal  oriented.  Psychiatric:        Mood and Affect: Mood normal.     ED Results / Procedures / Treatments   Labs (all labs ordered are listed, but only abnormal results are displayed) Labs Reviewed  COMPREHENSIVE METABOLIC PANEL - Abnormal; Notable for the following components:      Result Value   Glucose, Bld 101 (*)    All other components within normal limits  CBC  I-STAT BETA HCG BLOOD, ED ([facility], [facility], [facility] ONLY)    Procedures Procedures   Medications Ordered in ED Medications  albuterol (VENTOLIN HFA) 108 (90 Base) MCG/ACT inhaler 2 puff (has no administration in time range)    ED Course  I have reviewed the triage vital signs and the nursing notes.  Pertinent labs & imaging results that were available during my care of the patient were  reviewed by me and considered in my medical decision making (see chart for details).    MDM Rules/Calculators/A&P                           Patient presents with several complaints.  Initially she complains of intermittent asthma exacerbations.  On exam she is not short of breath.  She has no hypoxia and does not have any wheezing.  Patient was given albuterol MDI here in the emergency department.  No evidence of acute asthma exacerbation.  Will not give prednisone at this time however instructed patient to utilize her albuterol MDI as needed.  Refills for MDI and nebulizer given.  Also encouraged her to continue taking her Zyrtec as directed.  No signs of sepsis or infection.  No focal breath sounds.  Highly doubt pneumonia or PE.  Lab work today is reassuring.  No evidence of anemia or leukocytosis.  Pregnancy test is negative.  Encouraged patient to see OB/GYN or primary care for further evaluation of her irregular menses.   Final Clinical Impression(s) / ED Diagnoses Final diagnoses:  Mild intermittent asthma with exacerbation  Irregular menses    Rx / DC Orders ED Discharge Orders         Ordered    albuterol (VENTOLIN HFA) 108 (90  Base) MCG/ACT inhaler  Every 2 hours PRN        06/22/20 0023    albuterol (PROVENTIL) (2.5 MG/3ML) 0.083% nebulizer solution  Every 6 hours PRN        06/22/20 0023    For home use only DME Nebulizer machine        06/22/20 0023    Spacer/Aero-Holding Chambers (AEROCHAMBER PLUS WITH MASK) inhaler        06/22/20 0023           Zaylia Riolo, Dahlia Client, PA-C 06/22/20 0029    Dione Booze, MD 06/22/20 [phone]

## 2020-06-22 NOTE — ED Notes (Signed)
Patient verbalized understanding of discharge instructions. Opportunity for questions and answers.  

## 2020-07-21 ENCOUNTER — Other Ambulatory Visit (HOSPITAL_COMMUNITY)
Admission: RE | Admit: 2020-07-21 | Discharge: 2020-07-21 | Disposition: A | Discharge status: Home / Self Care | Payer: 59 | Source: Ambulatory Visit | Attending: Advanced Practice Midwife | Admitting: Advanced Practice Midwife

## 2020-07-21 ENCOUNTER — Other Ambulatory Visit: Payer: Self-pay

## 2020-07-21 ENCOUNTER — Encounter: Payer: Self-pay | Admitting: Advanced Practice Midwife

## 2020-07-21 ENCOUNTER — Ambulatory Visit (INDEPENDENT_AMBULATORY_CARE_PROVIDER_SITE_OTHER): Payer: 59 | Admitting: Advanced Practice Midwife

## 2020-07-21 VITALS — BP 113/74 | HR 74 | Ht 60.0 in | Wt 162.0 lb

## 2020-07-21 DIAGNOSIS — Z113 Encounter for screening for infections with a predominantly sexual mode of transmission: Secondary | ICD-10-CM

## 2020-07-21 DIAGNOSIS — N939 Abnormal uterine and vaginal bleeding, unspecified: Secondary | ICD-10-CM | POA: Diagnosis not present

## 2020-07-21 DIAGNOSIS — Z3009 Encounter for other general counseling and advice on contraception: Secondary | ICD-10-CM

## 2020-07-21 DIAGNOSIS — Z01419 Encounter for gynecological examination (general) (routine) without abnormal findings: Secondary | ICD-10-CM | POA: Diagnosis not present

## 2020-07-21 DIAGNOSIS — R102 Pelvic and perineal pain: Secondary | ICD-10-CM

## 2020-07-21 MED ORDER — NORETHIN-ETH ESTRAD-FE BIPHAS 1 MG-10 MCG / 10 MCG PO TABS: 1.0000 | ORAL_TABLET | Freq: Every day | ORAL | 11 refills | Status: DC

## 2020-07-21 NOTE — Progress Notes (Signed)
Subjective:     Kellie Padilla is a 25 y.o. female here at [facility] for a routine exam.  Current complaints: irregular menses, longer cycles with heavier bleeding and cramping in between periods x 3-4 months.  She desires STD screening today.  Personal health questionnaire reviewed: yes.  Do you have a primary care provider? yes Do you feel safe at home? yes    Risk factors for chronic health problems: Smoking: Never Alchohol/how much: Did not assess Pt BMI: Body mass index is 31.64 kg/m.   Gynecologic History Patient's last menstrual period was 06/28/2020 (exact date). Contraception: none Last Pap: 2021. Results were: normal per pt Last mammogram: n/a.   Obstetric History OB History  No obstetric history on file.     The following portions of the patient's history were reviewed and updated as appropriate: allergies, current medications, past family history, past medical history, past social history, past surgical history and problem list.  Review of Systems Pertinent items noted in HPI and remainder of comprehensive ROS otherwise negative.    Objective:   BP 113/74   Pulse 74   Ht 5' (1.524 m)   Wt 162 lb (73.5 kg)   LMP 06/28/2020 (Exact Date)   BMI 31.64 kg/m  VS reviewed, nursing note reviewed,  Constitutional: well developed, well nourished, no distress HEENT: normocephalic CV: normal rate Pulm/chest wall: normal effort Breast Exam: Deferred with low risks and shared decision making, discussed recommendation to start mammogram between 40-50 yo/ exam Abdomen: soft Neuro: alert and oriented x 3 Skin: warm, dry Psych: affect normal Pelvic exam: SSE deferred Bimanual exam: Cervix 0/long/high, firm, anterior, neg CMT, uterus nontender, nonenlarged, adnexa without tenderness, enlargement, or mass       Assessment/Plan:   1. Screen for STD (sexually transmitted disease)  - Cervicovaginal ancillary only( [place]) - Hepatitis B surface antigen - Hepatitis  C antibody - HIV Antibody (routine testing w rflx) - RPR  2. Encounter for counseling regarding contraception --Discussed LARCs as most effective forms of birth control.  Discussed benefits/risks of other methods.  Pt desires OCPs. She was unhappy with contraceptive patch in the past with mood side effects and weight gain.  Will start with low dose OCPs, pt aware that may need to change if AUB not controlled on low dose.  Discussed failure rates of OCPs with regular use. Reviewed risk factors and s/sx of DVT/PE/reasons to seek medical care.   - Norethindrone-Ethinyl Estradiol-Fe Biphas (LO LOESTRIN FE) 1 MG-10 MCG / 10 MCG tablet; Take 1 tablet by mouth daily.  Dispense: 28 tablet; Refill: 11  3. Abnormal uterine bleeding (AUB) --Pt with abnormal bleeding since menarche, but usually menses every month, with 40-50 day cycles.  Recent weight loss, new job may contribute to longer cycles in last 4-5 months.  --Discussed treatment options with mainstay of treatment hormonal contraception.  Pt would like to try OCPs.   --F/U in 3 months  - Norethindrone-Ethinyl Estradiol-Fe Biphas (LO LOESTRIN FE) 1 MG-10 MCG / 10 MCG tablet; Take 1 tablet by mouth daily.  Dispense: 28 tablet; Refill: 11   4. Acute pelvic pain, female --Pt with LLQ pain, worse in between periods but occurs monthly x 4-5 months.  STD testing today.  Bimanual exam wnl.  Outpatient [place] ordered.  - US PELVIC COMPLETE WITH TRANSVAGINAL; Future  Follow up in: 3 months or as needed.   Sharen Counter, CNM 8:56 AM

## 2020-07-21 NOTE — Progress Notes (Signed)
New GYN patient presents for problem visit today.   c/o: Irregular Periods  LMP:06/28/20 lasted 5 days starts off light then heavy. States cycles have been late.associated w/ pain and cramps.  Pt took at home pregnancy test was Negative   Last Pap: 2021 in [place]  Contraception: None   Pt wants STD Testing and  vaginal swab and wants to test for everything. Pt made aware ins may not cover 100% . Pt elects to have testing done.

## 2020-07-21 NOTE — Patient Instructions (Signed)
Abnormal Uterine Bleeding  Abnormal uterine bleeding is unusual bleeding from the uterus. It includes bleeding after sex, or bleeding or spotting between menstrual periods. It may also include bleeding that is heavier than normal, menstrual periods that last longer than usual, or bleeding that occurs after menopause. Abnormal uterine bleeding can affect teenagers, women in their reproductive years, pregnant women, and women who have reached menopause. Common causes of abnormal uterine bleeding include:  Pregnancy.  Growths of tissue (polyps).  Benign tumors or growths in the uterus (fibroids). These are not cancer.  Infection.  Cancer.  Too much or too little of some hormones in the body (hormonal imbalances). Any type of abnormal bleeding should be checked by a health care provider. Many cases are minor and simple to treat, but others may be more serious. Treatment will depend on the cause and severity of the bleeding. Follow these instructions at home: Medicines  Take over-the-counter and prescription medicines only as told by your health care provider.  Tell your health care provider about other medicines that you take. You may be asked to stop taking aspirin or medicines that contain aspirin. These medicines can make bleeding worse.  If you were prescribed iron pills, take them as told by your health care provider. Iron pills help to replace iron that your body loses because of this condition. Managing constipation In cases of severe bleeding, you may be asked to increase your iron intake to treat anemia. This may cause constipation. To prevent or treat constipation, you may need to:  Drink enough fluid to keep your urine pale yellow.  Take over-the-counter or prescription medicines.  Eat foods that are high in fiber, such as beans, whole grains, and fresh fruits and vegetables.  Limit foods that are high in fat and processed sugars, such as fried or sweet foods. General  instructions  Monitor your condition for any changes.  Do not use tampons, douche, or have sex until your health care provider says these things are okay.  Change your pads often.  Get regular exams. This includes pelvic exams and cervical cancer screenings. ? It is up to you to get the results of any tests that are done. Ask your health care provider, or the department that is doing the tests, when your results will be ready.  Keep all follow-up visits as told by your health care provider. This is important. Contact a health care provider if you:  Have bleeding that lasts for more than 1 week.  Feel dizzy at times.  Feel nauseous or you vomit.  Feel light-headed or weak.  Notice any other changes that show that your condition is getting worse. Get help right away if you:  Pass out.  Have bleeding that soaks through a pad every hour.  Have pain in the abdomen.  Have a fever or chills.  Become sweaty or weak.  Pass large blood clots from your vagina. Summary  Abnormal uterine bleeding is unusual bleeding from the uterus.  Any type of abnormal bleeding should be evaluated by a health care provider. Many cases are minor and simple to treat, but others may be more serious.  Treatment will depend on the cause of the bleeding.  Get help right away if you pass out, you have bleeding that soaks through a pad every hour, or you pass large blood clots from your vagina. This information is not intended to replace advice given to you by your health care provider. Make sure you discuss any questions you   have with your health care provider. Document Revised: 12/02/2019 Document Reviewed: 01/27/2019 Elsevier Patient Education  2021 [employer]vier Inc.  

## 2020-07-22 LAB — HEPATITIS B SURFACE ANTIGEN: Hepatitis B Surface Ag: NEGATIVE

## 2020-07-22 LAB — HIV ANTIBODY (ROUTINE TESTING W REFLEX): HIV Screen 4th Generation wRfx: NONREACTIVE

## 2020-07-22 LAB — CERVICOVAGINAL ANCILLARY ONLY
Bacterial Vaginitis (gardnerella): POSITIVE — AB
Candida Glabrata: NEGATIVE
Candida Vaginitis: POSITIVE — AB
Chlamydia: NEGATIVE
Comment: NEGATIVE
Comment: NEGATIVE
Comment: NEGATIVE
Comment: NEGATIVE
Comment: NEGATIVE
Comment: NORMAL
Neisseria Gonorrhea: NEGATIVE
Trichomonas: NEGATIVE

## 2020-07-22 LAB — RPR: RPR Ser Ql: NONREACTIVE

## 2020-07-22 LAB — HEPATITIS C ANTIBODY: Hep C Virus Ab: <0.1 s/co ratio (ref 0.0–0.9)

## 2020-08-10 ENCOUNTER — Ambulatory Visit
Admission: RE | Admit: 2020-08-10 | Discharge: 2020-08-10 | Disposition: A | Discharge status: Home / Self Care | Payer: 59 | Source: Ambulatory Visit | Attending: Advanced Practice Midwife | Admitting: Advanced Practice Midwife

## 2020-08-10 ENCOUNTER — Other Ambulatory Visit: Payer: Self-pay

## 2020-08-10 DIAGNOSIS — R102 Pelvic and perineal pain unspecified side: Secondary | ICD-10-CM

## 2020-08-10 DIAGNOSIS — N939 Abnormal uterine and vaginal bleeding, unspecified: Secondary | ICD-10-CM

## 2020-10-31 ENCOUNTER — Encounter (HOSPITAL_COMMUNITY): Payer: Self-pay

## 2020-10-31 ENCOUNTER — Emergency Department (HOSPITAL_COMMUNITY)
Admission: EM | Admit: 2020-10-31 | Discharge: 2020-11-01 | Disposition: A | Discharge status: Home / Self Care | Payer: 59 | Attending: Emergency Medicine | Admitting: Emergency Medicine

## 2020-10-31 ENCOUNTER — Other Ambulatory Visit: Payer: Self-pay

## 2020-10-31 DIAGNOSIS — U071 COVID-19: Secondary | ICD-10-CM | POA: Diagnosis not present

## 2020-10-31 DIAGNOSIS — Z5321 Procedure and treatment not carried out due to patient leaving prior to being seen by health care provider: Secondary | ICD-10-CM | POA: Insufficient documentation

## 2020-10-31 DIAGNOSIS — R112 Nausea with vomiting, unspecified: Secondary | ICD-10-CM | POA: Diagnosis present

## 2020-10-31 LAB — COMPREHENSIVE METABOLIC PANEL
ALT: 16 U/L (ref 0–44)
AST: 19 U/L (ref 15–41)
Albumin: 4.8 g/dL (ref 3.5–5.0)
Alkaline Phosphatase: 48 U/L (ref 38–126)
Anion gap: 8 (ref 5–15)
BUN: 11 mg/dL (ref 6–20)
CO2: 23 mmol/L (ref 22–32)
Calcium: 9.1 mg/dL (ref 8.9–10.3)
Chloride: 105 mmol/L (ref 98–111)
Creatinine, Ser: 0.66 mg/dL (ref 0.44–1.00)
GFR, Estimated: >60 mL/min (ref >60)
Glucose, Bld: 95 mg/dL (ref 70–99)
Potassium: 3.7 mmol/L (ref 3.5–5.1)
Sodium: 136 mmol/L (ref 135–145)
Total Bilirubin: 0.8 mg/dL (ref 0.3–1.2)
Total Protein: 8.3 g/dL — ABNORMAL HIGH (ref 6.5–8.1)

## 2020-10-31 LAB — CBC
HCT: 46.3 % — ABNORMAL HIGH (ref 36.0–46.0)
Hemoglobin: 14.7 g/dL (ref 12.0–15.0)
MCH: 28.2 pg (ref 26.0–34.0)
MCHC: 31.7 g/dL (ref 30.0–36.0)
MCV: 88.7 fL (ref 80.0–100.0)
Platelets: 192 10*3/uL (ref 150–400)
RBC: 5.22 MIL/uL — ABNORMAL HIGH (ref 3.87–5.11)
RDW: 13.7 % (ref 11.5–15.5)
WBC: 5.6 10*3/uL (ref 4.0–10.5)
nRBC: 0 % (ref 0.0–0.2)

## 2020-10-31 LAB — I-STAT BETA HCG BLOOD, ED (MC, WL, AP ONLY): I-stat hCG, quantitative: <5 m[IU]/mL (ref <5)

## 2020-10-31 LAB — LIPASE, BLOOD: Lipase: 34 U/L (ref 11–51)

## 2020-10-31 NOTE — ED Provider Notes (Signed)
Emergency Medicine Provider Triage Evaluation Note  Kellie Padilla , a 25 y.o. female  was evaluated in triage.  Pt complains of nausea and vomiting since yesterday.LPM 7/18-7/21.   Review of Systems  Positive: nausea, vomiting, vaginal discharge Negative:abdominal pain, urinary symptoms    Physical Exam  BP 110/86   Pulse 98   Temp 99.1 F (37.3 C) (Oral)   Resp 18   SpO2 99%  Gen:   Awake, no distress   Resp:  Normal effort  MSK:   Moves extremities without difficulty  Other:  Actively vomtiing while in the Triage room  Medical Decision Making  Medically screening exam initiated at 5:52 PM.  Appropriate orders placed.  Kellie Padilla was informed that the remainder of the evaluation will be completed by another provider, this initial triage assessment does not replace that evaluation, and the importance of remaining in the ED until their evaluation is complete.  LPM 7/18  Also congested and with productive cough No covid vaccines.    Claude Manges, PA-C 10/31/20 1753    Tilden Fossa, MD 10/31/20 [phone]

## 2020-10-31 NOTE — ED Triage Notes (Signed)
Pt states she got overheated yesterday and has not been able to keep anything down. Pt c/o N/V.

## 2020-11-01 ENCOUNTER — Ambulatory Visit (INDEPENDENT_AMBULATORY_CARE_PROVIDER_SITE_OTHER)
Admission: RE | Admit: 2020-11-01 | Discharge: 2020-11-01 | Disposition: A | Discharge status: Home / Self Care | Payer: 59 | Source: Ambulatory Visit

## 2020-11-01 ENCOUNTER — Encounter (HOSPITAL_COMMUNITY): Payer: Self-pay

## 2020-11-01 VITALS — BP 109/69 | HR 97 | Temp 100.0°F | Resp 18

## 2020-11-01 DIAGNOSIS — R1030 Lower abdominal pain, unspecified: Secondary | ICD-10-CM

## 2020-11-01 DIAGNOSIS — N898 Other specified noninflammatory disorders of vagina: Secondary | ICD-10-CM | POA: Insufficient documentation

## 2020-11-01 DIAGNOSIS — Z113 Encounter for screening for infections with a predominantly sexual mode of transmission: Secondary | ICD-10-CM | POA: Insufficient documentation

## 2020-11-01 DIAGNOSIS — N3001 Acute cystitis with hematuria: Secondary | ICD-10-CM | POA: Insufficient documentation

## 2020-11-01 LAB — POCT URINALYSIS DIPSTICK, ED / UC
Glucose, UA: NEGATIVE mg/dL
Nitrite: NEGATIVE
Protein, ur: 100 mg/dL — AB
Specific Gravity, Urine: 1.025 (ref 1.005–1.030)
Urobilinogen, UA: >=8 mg/dL (ref 0.0–1.0)
pH: 6 (ref 5.0–8.0)

## 2020-11-01 LAB — POC URINE PREG, ED: Preg Test, Ur: NEGATIVE

## 2020-11-01 LAB — SARS CORONAVIRUS 2 (TAT 6-24 HRS): SARS Coronavirus 2: POSITIVE — AB

## 2020-11-01 MED ORDER — NITROFURANTOIN MONOHYD MACRO 100 MG PO CAPS: 100.0000 mg | ORAL_CAPSULE | Freq: Two times a day (BID) | ORAL | 0 refills | Status: AC

## 2020-11-01 NOTE — Discharge Instructions (Addendum)
You are being treated for urinary tract infection with Macrobid antibiotic.  Urine culture and STD vaginal swab are pending.  We will call if these are positive and treat as appropriate.  Please increase clear oral fluid intake.  Also continue to monitor fevers and treat as appropriate with Tylenol.  Please go to the hospital if any pelvic pain develops or if fever is consistently elevated.

## 2020-11-01 NOTE — ED Provider Notes (Signed)
[facility]    CSN: [id number] Arrival date & time: 11/01/20  1741      History   Chief Complaint Chief Complaint  Patient presents with   Vaginal Discharge    HPI Kellie Padilla is a 25 y.o. female.   Patient presents with 1 week history of vaginal irritation, yellow vaginal discharge with odor, lower abdominal pain.  Denies any urinary burning or frequency.  Denies any known exposure to STD but has had unprotected sexual intercourse recently.  Has also had a change in detergent for clothing.  Denies any pelvic pain but does have fever.  Last menstrual cycle was 10/24/2020.  Denies any vaginal lesions.  Did test positive for COVID-19 at ED last night.   Vaginal Discharge  Past Medical History:  Diagnosis Date   Asthma     There are no problems to display for this patient.   History reviewed. No pertinent surgical history.  OB History   No obstetric history on file.      Home Medications    Prior to Admission medications   Medication Sig Start Date End Date Taking? Authorizing Provider  nitrofurantoin, macrocrystal-monohydrate, (MACROBID) 100 MG capsule Take 1 capsule (100 mg total) by mouth 2 (two) times daily for 7 days. 11/01/20 11/08/20 Yes Lance Muss, FNP  acetaminophen (TYLENOL) 325 MG tablet Take 650 mg by mouth every 6 (six) hours as needed for moderate pain. Patient not taking: Reported on 07/21/2020    [provider]  albuterol (PROVENTIL) (2.5 MG/3ML) 0.083% nebulizer solution Take 3 mLs (2.5 mg total) by nebulization every 6 (six) hours as needed for wheezing or shortness of breath. 06/22/20   Muthersbaugh, Dahlia Client, PA-C  albuterol (VENTOLIN HFA) 108 (90 Base) MCG/ACT inhaler Inhale 2 puffs into the lungs every 2 (two) hours as needed for wheezing or shortness of breath (cough). 06/22/20   Muthersbaugh, Dahlia Client, PA-C  cetirizine (ZYRTEC ALLERGY) 10 MG tablet Take 1 tablet (10 mg total) by mouth daily. Patient taking differently: Take 10 mg  by mouth daily as needed for allergies. 10/15/17   Petrucelli, Samantha R, PA-C  hydroxypropyl methylcellulose / hypromellose (ISOPTO TEARS / GONIOVISC) 2.5 % ophthalmic solution Place 1 drop into both eyes 3 (three) times daily as needed for dry eyes. Patient not taking: Reported on 07/21/2020    [provider]  Norethindrone-Ethinyl Estradiol-Fe Biphas (LO LOESTRIN FE) 1 MG-10 MCG / 10 MCG tablet Take 1 tablet by mouth daily. 07/21/20   Leftwich-Kirby, Wilmer Floor, CNM  Spacer/Aero-Holding Chambers (AEROCHAMBER PLUS WITH MASK) inhaler Use as instructed 06/22/20   Muthersbaugh, Dahlia Client, PA-C    Family History Family History  Problem Relation Age of Onset   Healthy Mother    Healthy Father     Social History Social History   Tobacco Use   Smoking status: Passive Smoke Exposure - Never Smoker   Smokeless tobacco: Never  Vaping Use   Vaping Use: Never used  Substance Use Topics   Alcohol use: Yes    Comment: social    Drug use: No     Allergies   Dust mite extract, Pollen extract, and Shellfish allergy   Review of Systems Review of Systems Per HPI  Physical Exam Triage Vital Signs ED Triage Vitals  Enc Vitals Group     BP 11/01/20 1836 109/69     Pulse Rate 11/01/20 1836 97     Resp 11/01/20 1836 18     Temp 11/01/20 1836 100 F (37.8 C)  Temp Source 11/01/20 1836 Oral     SpO2 11/01/20 1836 97 %     Weight --      Height --      Head Circumference --      Peak Flow --      Pain Score 11/01/20 1834 7     Pain Loc --      Pain Edu? --      Excl. in GC? --    No data found.  Updated Vital Signs BP 109/69 (BP Location: Right Arm)   Pulse 97   Temp 100 F (37.8 C) (Oral)   Resp 18   LMP 10/24/2020   SpO2 97%   Visual Acuity Right Eye Distance:   Left Eye Distance:   Bilateral Distance:    Right Eye Near:   Left Eye Near:    Bilateral Near:     Physical Exam Constitutional:      Appearance: Normal appearance.  HENT:     Head: Normocephalic  and atraumatic.  Eyes:     Extraocular Movements: Extraocular movements intact.     Conjunctiva/sclera: Conjunctivae normal.  Pulmonary:     Effort: Pulmonary effort is normal.  Abdominal:     General: Abdomen is flat.     Tenderness: There is no abdominal tenderness.     Hernia: No hernia is present.  Genitourinary:    Comments: Deferred with shared decision making.  Self swab performed. Neurological:     General: No focal deficit present.     Mental Status: She is alert and oriented to person, place, and time. Mental status is at baseline.  Psychiatric:        Mood and Affect: Mood normal.        Behavior: Behavior normal.        Thought Content: Thought content normal.        Judgment: Judgment normal.     UC Treatments / Results  Labs (all labs ordered are listed, but only abnormal results are displayed) Labs Reviewed  POCT URINALYSIS DIPSTICK, ED / UC - Abnormal; Notable for the following components:      Result Value   Bilirubin Urine SMALL (*)    Ketones, ur TRACE (*)    Hgb urine dipstick TRACE (*)    Protein, ur 100 (*)    Leukocytes,Ua LARGE (*)    All other components within normal limits  URINE CULTURE  POC URINE PREG, ED  CERVICOVAGINAL ANCILLARY ONLY    EKG   Radiology No results found.  Procedures Procedures (including critical care time)  Medications Ordered in UC Medications - No data to display  Initial Impression / Assessment and Plan / UC Course  I have reviewed the triage vital signs and the nursing notes.  Pertinent labs & imaging results that were available during my care of the patient were reviewed by me and considered in my medical decision making (see chart for details).     Urinalysis showing large amount of leukocytes pending possible urinary tract infection.  Will treat with Macrobid x7 days.  Vaginal discharge could also be indicative of STD that is causing vaginal infection.  STD vaginal swab pending.  will change treatment  plan if appropriate once STD swab has resulted.  Urine culture is also pending.  Patient has fever but unsure of cause of fever due to patient having COVID-19.  Patient advised to monitor fever closely and to go to the hospital if pelvic pain develops or if fever is  consistently elevated. Discussed strict return precautions. Patient verbalized understanding and is agreeable with plan.  Final Clinical Impressions(s) / UC Diagnoses   Final diagnoses:  Acute cystitis with hematuria  Vaginal discharge  Screening for venereal disease  Lower abdominal pain     Discharge Instructions      You are being treated for urinary tract infection with Macrobid antibiotic.  Urine culture and STD vaginal swab are pending.  We will call if these are positive and treat as appropriate.  Please increase clear oral fluid intake.  Also continue to monitor fevers and treat as appropriate with Tylenol.  Please go to the hospital if any pelvic pain develops or if fever is consistently elevated.     ED Prescriptions     Medication Sig Dispense Auth. Provider   nitrofurantoin, macrocrystal-monohydrate, (MACROBID) 100 MG capsule Take 1 capsule (100 mg total) by mouth 2 (two) times daily for 7 days. 14 capsule Lance Muss, FNP      PDMP not reviewed this encounter.   Lance Muss, FNP 11/01/20 [phone]

## 2020-11-01 NOTE — ED Triage Notes (Signed)
Pt c/o vaginal irritation, discharge (yellow), odor, lower abd pain that started about a week ago (did change detergent recently). Pt states she was in ED last night and tested positive for covid.

## 2020-11-01 NOTE — ED Notes (Signed)
Called for room with no answer x2

## 2020-11-02 LAB — CERVICOVAGINAL ANCILLARY ONLY
Bacterial Vaginitis (gardnerella): POSITIVE — AB
Candida Glabrata: NEGATIVE
Candida Vaginitis: NEGATIVE
Chlamydia: NEGATIVE
Comment: NEGATIVE
Comment: NEGATIVE
Comment: NEGATIVE
Comment: NEGATIVE
Comment: NEGATIVE
Comment: NORMAL
Neisseria Gonorrhea: NEGATIVE
Trichomonas: POSITIVE — AB

## 2020-11-03 ENCOUNTER — Telehealth (HOSPITAL_COMMUNITY): Payer: Self-pay | Admitting: Emergency Medicine

## 2020-11-03 LAB — URINE CULTURE: Culture: 60000 — AB

## 2020-11-03 MED ORDER — METRONIDAZOLE 500 MG PO TABS: 500.0000 mg | ORAL_TABLET | Freq: Two times a day (BID) | ORAL | 0 refills | Status: DC

## 2021-01-06 ENCOUNTER — Ambulatory Visit (HOSPITAL_COMMUNITY): Payer: 59

## 2021-03-20 ENCOUNTER — Other Ambulatory Visit: Payer: Self-pay

## 2021-03-20 ENCOUNTER — Ambulatory Visit (HOSPITAL_COMMUNITY)
Admission: EM | Admit: 2021-03-20 | Discharge: 2021-03-20 | Disposition: A | Discharge status: Home / Self Care | Payer: 59 | Attending: Emergency Medicine | Admitting: Emergency Medicine

## 2021-03-20 ENCOUNTER — Encounter (HOSPITAL_COMMUNITY): Payer: Self-pay | Admitting: Emergency Medicine

## 2021-03-20 DIAGNOSIS — J4521 Mild intermittent asthma with (acute) exacerbation: Secondary | ICD-10-CM

## 2021-03-20 MED ORDER — PREDNISONE 10 MG (21) PO TBPK: ORAL_TABLET | ORAL | 0 refills | Status: DC

## 2021-03-20 MED ORDER — BENZONATATE 100 MG PO CAPS: 100.0000 mg | ORAL_CAPSULE | Freq: Three times a day (TID) | ORAL | 0 refills | Status: DC

## 2021-03-20 MED ORDER — ALBUTEROL SULFATE HFA 108 (90 BASE) MCG/ACT IN AERS: 2.0000 | INHALATION_SPRAY | RESPIRATORY_TRACT | 0 refills | Status: DC | PRN

## 2021-03-20 NOTE — ED Provider Notes (Signed)
[facility]    CSN: [id number] Arrival date & time: 03/20/21  1246      History   Chief Complaint Chief Complaint  Patient presents with   Cough    HPI Kellie Padilla is a 25 y.o. female.   Patient presents with concerns of persistent cough along with chest tightness and wheezing. She reports she had a cold at Thanksgiving and all her symptoms resolved except the cough. She has history of asthma but does not have an inhaler. She has had wheezing, dry cough worse at night, as well as chest tightness and sometimes discomfort with coughing. The patient denies fever, congestion, sore throat, headache, body aches, or n/v/d. She has been taking Mucinex with minimal improvement.   The history is provided by the patient.  Cough Associated symptoms: wheezing   Associated symptoms: no chest pain, no ear pain, no fever, no headaches, no myalgias, no rash, no shortness of breath and no sore throat    Past Medical History:  Diagnosis Date   Asthma     There are no problems to display for this patient.   History reviewed. No pertinent surgical history.  OB History   No obstetric history on file.      Home Medications    Prior to Admission medications   Medication Sig Start Date End Date Taking? Authorizing Provider  albuterol (VENTOLIN HFA) 108 (90 Base) MCG/ACT inhaler Inhale 2 puffs into the lungs every 4 (four) hours as needed for wheezing or shortness of breath. 03/20/21  Yes Jamarkis Branam L, PA  benzonatate (TESSALON) 100 MG capsule Take 1 capsule (100 mg total) by mouth every 8 (eight) hours. 03/20/21  Yes Tadan Shill L, PA  predniSONE (STERAPRED UNI-PAK 21 TAB) 10 MG (21) TBPK tablet Take as directed 03/20/21  Yes Kanyla Omeara L, PA  acetaminophen (TYLENOL) 325 MG tablet Take 650 mg by mouth every 6 (six) hours as needed for moderate pain. Patient not taking: Reported on 07/21/2020    [provider]  cetirizine (ZYRTEC ALLERGY) 10 MG tablet Take 1 tablet  (10 mg total) by mouth daily. Patient taking differently: Take 10 mg by mouth daily as needed for allergies. 10/15/17   Petrucelli, Samantha R, PA-C  hydroxypropyl methylcellulose / hypromellose (ISOPTO TEARS / GONIOVISC) 2.5 % ophthalmic solution Place 1 drop into both eyes 3 (three) times daily as needed for dry eyes. Patient not taking: Reported on 07/21/2020    [provider]  metroNIDAZOLE (FLAGYL) 500 MG tablet Take 1 tablet (500 mg total) by mouth 2 (two) times daily. 11/03/20   Lamptey, Britta Mccreedy, MD  Norethindrone-Ethinyl Estradiol-Fe Biphas (LO LOESTRIN FE) 1 MG-10 MCG / 10 MCG tablet Take 1 tablet by mouth daily. 07/21/20   Leftwich-Kirby, Wilmer Floor, CNM  Spacer/Aero-Holding Chambers (AEROCHAMBER PLUS WITH MASK) inhaler Use as instructed 06/22/20   Muthersbaugh, Dahlia Client, PA-C    Family History Family History  Problem Relation Age of Onset   Healthy Mother    Healthy Father     Social History Social History   Tobacco Use   Smoking status: Passive Smoke Exposure - Never Smoker   Smokeless tobacco: Never  Vaping Use   Vaping Use: Never used  Substance Use Topics   Alcohol use: Yes    Comment: social    Drug use: No     Allergies   Dust mite extract, Pollen extract, and Shellfish allergy   Review of Systems Review of Systems  Constitutional:  Negative for fatigue and  fever.  HENT:  Negative for congestion, ear pain and sore throat.   Respiratory:  Positive for cough, chest tightness and wheezing. Negative for shortness of breath.   Cardiovascular:  Negative for chest pain.  Gastrointestinal:  Negative for diarrhea, nausea and vomiting.  Musculoskeletal:  Negative for myalgias.  Skin:  Negative for rash.  Neurological:  Negative for dizziness and headaches.    Physical Exam Triage Vital Signs ED Triage Vitals  Enc Vitals Group     BP 03/20/21 1445 114/65     Pulse Rate 03/20/21 1445 63     Resp 03/20/21 1445 18     Temp 03/20/21 1445 97.6 F (36.4 C)      Temp Source 03/20/21 1445 Oral     SpO2 03/20/21 1445 99 %     Weight --      Height --      Head Circumference --      Peak Flow --      Pain Score 03/20/21 1444 8     Pain Loc --      Pain Edu? --      Excl. in GC? --    No data found.  Updated Vital Signs BP 114/65 (BP Location: Left Arm)   Pulse 63   Temp 97.6 F (36.4 C) (Oral)   Resp 18   LMP 02/20/2021   SpO2 99%   Visual Acuity Right Eye Distance:   Left Eye Distance:   Bilateral Distance:    Right Eye Near:   Left Eye Near:    Bilateral Near:     Physical Exam Vitals and nursing note reviewed.  Constitutional:      General: She is not in acute distress. HENT:     Head: Normocephalic.     Nose: Nose normal.     Mouth/Throat:     Mouth: Mucous membranes are moist.     Pharynx: Oropharynx is clear.  Eyes:     Conjunctiva/sclera: Conjunctivae normal.     Pupils: Pupils are equal, round, and reactive to light.  Cardiovascular:     Rate and Rhythm: Normal rate and regular rhythm.     Heart sounds: Normal heart sounds.  Pulmonary:     Effort: Pulmonary effort is normal. No respiratory distress.     Breath sounds: Normal breath sounds. No stridor. No wheezing (no current wheezing), rhonchi or rales.  Lymphadenopathy:     Cervical: No cervical adenopathy.  Skin:    Findings: No rash.  Neurological:     Mental Status: She is alert.     Gait: Gait normal.  Psychiatric:        Mood and Affect: Mood normal.     UC Treatments / Results  Labs (all labs ordered are listed, but only abnormal results are displayed) Labs Reviewed - No data to display  EKG   Radiology No results found.  Procedures Procedures (including critical care time)  Medications Ordered in UC Medications - No data to display  Initial Impression / Assessment and Plan / UC Course  I have reviewed the triage vital signs and the nursing notes.  Pertinent labs & imaging results that were available during my care of the patient  were reviewed by me and considered in my medical decision making (see chart for details).     S/s consistent with asthma exacerbation after recent URI. No evidence of active infection. Prednisone and albuterol for asthma flare, cough medicine. Follow-up and ER precautions discussed.   E/M: 1  chronic illness with exacerbation, no data, moderate risk due to prescription management  Final Clinical Impressions(s) / UC Diagnoses   Final diagnoses:  Mild intermittent asthma with exacerbation     Discharge Instructions      Take prednisone as prescribed and use inhaler as needed to help with wheezing and chest tightness. Take Tessalon as prescribed as needed for cough. Follow-up with PCP if no improvement in a week. Go to the ER if develop difficulty breathing not improved with inhaler.     ED Prescriptions     Medication Sig Dispense Auth. Provider   predniSONE (STERAPRED UNI-PAK 21 TAB) 10 MG (21) TBPK tablet Take as directed 1 each Vallery Sa, Keiva Dina L, PA   albuterol (VENTOLIN HFA) 108 (90 Base) MCG/ACT inhaler Inhale 2 puffs into the lungs every 4 (four) hours as needed for wheezing or shortness of breath. 1 each Vallery Sa, Katielynn Horan L, PA   benzonatate (TESSALON) 100 MG capsule Take 1 capsule (100 mg total) by mouth every 8 (eight) hours. 21 capsule Vallery Sa, Katerine Morua L, PA      PDMP not reviewed this encounter.   Estanislado Pandy, [place] 03/20/21 1513

## 2021-03-20 NOTE — ED Triage Notes (Signed)
Pt reports had cold at thanksgiving and cough will not go away. Taking Mucinex. Chest pains with coughing.  Hx asthma and reports needs an inhaler.

## 2021-03-20 NOTE — Discharge Instructions (Addendum)
Take prednisone as prescribed and use inhaler as needed to help with wheezing and chest tightness. Take Tessalon as prescribed as needed for cough. Follow-up with PCP if no improvement in a week. Go to the ER if develop difficulty breathing not improved with inhaler.

## 2021-07-24 NOTE — Progress Notes (Signed)
? ?New Patient Office Visit ? ?Subjective:  ?Patient ID: Kellie Padilla, female    DOB: July 04, 1995  Age: 26 y.o. MRN: [id number] ? ?CC:  ?Chief Complaint  ?Patient presents with  ? Establish Care  ?  Np. Est care. Pt c/o asthma symptoms and increase of anxiety. Pt also states of chronic constipation, one BM a month several months.   ? ? ?HPI ?Kellie Padilla presents for new patient visit to establish care.  Introduced to Designer, jewellery role and practice setting.  All questions answered.  Discussed provider/patient relationship and expectations. ? ?She has a history of anxiety. She states that she was on medication in the past. Overall, her anxiety is stable. She has not had any panic attacks recently.  ? ?She has a history of asthma and allergies. She has an allergy to dust and pollen and gets exposed to this at work. She is currently taking zyrtec daily as needed, she took it yesterday and today. She has an albuterol inhaler that she uses as needed. She has not needed to use her inhaler in the past week. The last time she needed it was at the beginning of the month when the pollen first started.  ? ?She endorses constipation. She usually has small bowel movements once a week. She endorses LLQ and bloating. She also endorses urinating frequently. She does pass gas. She has not tried anything over the counter. Because of her work schedule she tends to eat 1-2x a day and has snacks. She does endorses having to strain for bowel movements at times and has noted blood on the toilet tissue a few times.  ? ? ?  07/25/2021  ? 11:07 AM  ?Depression screen PHQ 2/9  ?Decreased Interest 1  ?Down, Depressed, Hopeless 0  ?PHQ - 2 Score 1  ?Altered sleeping 1  ?Tired, decreased energy 2  ?Change in appetite 1  ?Feeling bad or failure about yourself  1  ?Trouble concentrating 0  ?Moving slowly or fidgety/restless 0  ?Suicidal thoughts 0  ?PHQ-9 Score 6  ?Difficult doing work/chores Not difficult at all  ? ? ?  07/25/2021  ? 11:07 AM   ?GAD 7 : Generalized Anxiety Score  ?Nervous, Anxious, on Edge 1  ?Control/stop worrying 1  ?Restless 0  ?Easily annoyed or irritable 1  ?Afraid - awful might happen 1  ? ? ?Past Medical History:  ?Diagnosis Date  ? Asthma   ? ? ?History reviewed. No pertinent surgical history. ? ?Family History  ?Problem Relation Age of Onset  ? Healthy Mother   ? Healthy Father   ? ? ?Social History  ? ?Socioeconomic History  ? Marital status: Single  ?  Spouse name: Not on file  ? Number of children: Not on file  ? Years of education: Not on file  ? Highest education level: Not on file  ?Occupational History  ? Not on file  ?Tobacco Use  ? Smoking status: Never  ?  Passive exposure: Yes  ? Smokeless tobacco: Never  ?Vaping Use  ? Vaping Use: Never used  ?Substance and Sexual Activity  ? Alcohol use: Yes  ?  Comment: occasionally  ? Drug use: No  ? Sexual activity: Yes  ?  Partners: Male  ?  Birth control/protection: None  ?Other Topics Concern  ? Not on file  ?Social History Narrative  ? Not on file  ? ?Social Determinants of Health  ? ?Financial Resource Strain: Not on file  ?Food Insecurity: Not on file  ?  Transportation Needs: Not on file  ?Physical Activity: Not on file  ?Stress: Not on file  ?Social Connections: Not on file  ?Intimate Partner Violence: Not on file  ? ? ?ROS ?Review of Systems  ?Constitutional:  Positive for fatigue.  ?HENT: Negative.    ?Eyes:  Positive for itching. Negative for pain, redness and visual disturbance.  ?Respiratory: Negative.    ?Cardiovascular: Negative.   ?Gastrointestinal:  Positive for abdominal distention, abdominal pain (LLQ at times) and constipation. Negative for diarrhea and nausea.  ?Endocrine: Positive for polyuria. Negative for cold intolerance and heat intolerance.  ?Genitourinary:  Positive for frequency. Negative for dysuria.  ?Musculoskeletal: Negative.   ?Skin:   ?     Dry skin  ?Allergic/Immunologic: Positive for environmental allergies.  ?Neurological: Negative.    ?Psychiatric/Behavioral:  The patient is nervous/anxious (stable).   ? ?Objective:  ? ?Today's Vitals: BP 116/70 (BP Location: Left Arm, Patient Position: Sitting, Cuff Size: Normal)   Pulse 77   Temp (!) 97.5 ?F (36.4 ?C) (Temporal)   Ht 5' 0.75" (1.543 m)   Wt 172 lb 12.8 oz (78.4 kg)   LMP 06/13/2021 (Exact Date)   SpO2 100%   BMI 32.92 kg/m?  ? ?Physical Exam ?Vitals and nursing note reviewed.  ?Constitutional:   ?   General: She is not in acute distress. ?   Appearance: Normal appearance.  ?HENT:  ?   Head: Normocephalic and atraumatic.  ?   Right Ear: Tympanic membrane, ear canal and external ear normal.  ?   Left Ear: Tympanic membrane, ear canal and external ear normal.  ?Eyes:  ?   Conjunctiva/sclera: Conjunctivae normal.  ?Cardiovascular:  ?   Rate and Rhythm: Normal rate and regular rhythm.  ?   Pulses: Normal pulses.  ?   Heart sounds: Normal heart sounds.  ?Pulmonary:  ?   Effort: Pulmonary effort is normal.  ?   Breath sounds: Normal breath sounds.  ?Abdominal:  ?   General: Bowel sounds are normal. There is no distension.  ?   Palpations: Abdomen is soft.  ?   Tenderness: There is no abdominal tenderness. There is no guarding or rebound.  ?   Hernia: No hernia is present.  ?Musculoskeletal:     ?   General: Normal range of motion.  ?   Cervical back: Normal range of motion. No tenderness.  ?Lymphadenopathy:  ?   Cervical: No cervical adenopathy.  ?Skin: ?   General: Skin is warm and dry.  ?Neurological:  ?   General: No focal deficit present.  ?   Mental Status: She is alert and oriented to person, place, and time.  ?Psychiatric:     ?   Mood and Affect: Mood normal.     ?   Behavior: Behavior normal.     ?   Thought Content: Thought content normal.     ?   Judgment: Judgment normal.  ? ? ?Assessment & Plan:  ? ?Problem List Items Addressed This Visit   ?None ? ? ?Outpatient Encounter Medications as of 07/25/2021  ?Medication Sig  ? albuterol (VENTOLIN HFA) 108 (90 Base) MCG/ACT inhaler Inhale  2 puffs into the lungs every 4 (four) hours as needed for wheezing or shortness of breath.  ? cetirizine (ZYRTEC ALLERGY) 10 MG tablet Take 1 tablet (10 mg total) by mouth daily. (Patient taking differently: Take 10 mg by mouth daily as needed for allergies.)  ? [DISCONTINUED] acetaminophen (TYLENOL) 325 MG tablet Take 650 mg by  mouth every 6 (six) hours as needed for moderate pain. (Patient not taking: Reported on 07/21/2020)  ? [DISCONTINUED] benzonatate (TESSALON) 100 MG capsule Take 1 capsule (100 mg total) by mouth every 8 (eight) hours.  ? [DISCONTINUED] hydroxypropyl methylcellulose / hypromellose (ISOPTO TEARS / GONIOVISC) 2.5 % ophthalmic solution Place 1 drop into both eyes 3 (three) times daily as needed for dry eyes. (Patient not taking: Reported on 07/21/2020)  ? [DISCONTINUED] metroNIDAZOLE (FLAGYL) 500 MG tablet Take 1 tablet (500 mg total) by mouth 2 (two) times daily.  ? [DISCONTINUED] Norethindrone-Ethinyl Estradiol-Fe Biphas (LO LOESTRIN FE) 1 MG-10 MCG / 10 MCG tablet Take 1 tablet by mouth daily.  ? [DISCONTINUED] predniSONE (STERAPRED UNI-PAK 21 TAB) 10 MG (21) TBPK tablet Take as directed  ? [DISCONTINUED] Spacer/Aero-Holding Chambers (AEROCHAMBER PLUS WITH MASK) inhaler Use as instructed  ? ?No facility-administered encounter medications on file as of 07/25/2021.  ? ? ?Follow-up: No follow-ups on file.  ? ?Charyl Dancer, NP ? ?

## 2021-07-25 ENCOUNTER — Ambulatory Visit (INDEPENDENT_AMBULATORY_CARE_PROVIDER_SITE_OTHER): Payer: 59 | Admitting: Nurse Practitioner

## 2021-07-25 ENCOUNTER — Encounter: Payer: Self-pay | Admitting: Nurse Practitioner

## 2021-07-25 VITALS — BP 116/70 | HR 77 | Temp 97.5°F | Ht 60.75 in | Wt 172.8 lb

## 2021-07-25 DIAGNOSIS — Z13220 Encounter for screening for lipoid disorders: Secondary | ICD-10-CM

## 2021-07-25 DIAGNOSIS — K5900 Constipation, unspecified: Secondary | ICD-10-CM | POA: Diagnosis not present

## 2021-07-25 DIAGNOSIS — R350 Frequency of micturition: Secondary | ICD-10-CM

## 2021-07-25 DIAGNOSIS — Z136 Encounter for screening for cardiovascular disorders: Secondary | ICD-10-CM | POA: Diagnosis not present

## 2021-07-25 DIAGNOSIS — J4520 Mild intermittent asthma, uncomplicated: Secondary | ICD-10-CM | POA: Diagnosis not present

## 2021-07-25 DIAGNOSIS — F419 Anxiety disorder, unspecified: Secondary | ICD-10-CM

## 2021-07-25 LAB — COMPREHENSIVE METABOLIC PANEL
ALT: 11 U/L (ref 0–35)
AST: 15 U/L (ref 0–37)
Albumin: 4.5 g/dL (ref 3.5–5.2)
Alkaline Phosphatase: 46 U/L (ref 39–117)
BUN: 13 mg/dL (ref 6–23)
CO2: 27 mEq/L (ref 19–32)
Calcium: 9.3 mg/dL (ref 8.4–10.5)
Chloride: 103 mEq/L (ref 96–112)
Creatinine, Ser: 0.59 mg/dL (ref 0.40–1.20)
GFR: 126.83 mL/min (ref >60.00)
Glucose, Bld: 91 mg/dL (ref 70–99)
Potassium: 4 mEq/L (ref 3.5–5.1)
Sodium: 138 mEq/L (ref 135–145)
Total Bilirubin: 0.7 mg/dL (ref 0.2–1.2)
Total Protein: 7.6 g/dL (ref 6.0–8.3)

## 2021-07-25 LAB — CBC
HCT: 39.6 % (ref 36.0–46.0)
Hemoglobin: 13 g/dL (ref 12.0–15.0)
MCHC: 32.9 g/dL (ref 30.0–36.0)
MCV: 87.2 fl (ref 78.0–100.0)
Platelets: 197 10*3/uL (ref 150.0–400.0)
RBC: 4.54 Mil/uL (ref 3.87–5.11)
RDW: 13.9 % (ref 11.5–15.5)
WBC: 3.9 10*3/uL — ABNORMAL LOW (ref 4.0–10.5)

## 2021-07-25 LAB — LIPID PANEL
Cholesterol: 164 mg/dL (ref 0–200)
HDL: 66.1 mg/dL (ref >39.00)
LDL Cholesterol: 93 mg/dL (ref 0–99)
NonHDL: 97.69
Total CHOL/HDL Ratio: 2
Triglycerides: 25 mg/dL (ref 0.0–149.0)
VLDL: 5 mg/dL (ref 0.0–40.0)

## 2021-07-25 LAB — HEMOGLOBIN A1C: Hgb A1c MFr Bld: 5.8 % (ref 4.6–6.5)

## 2021-07-25 LAB — TSH: TSH: 1.45 u[IU]/mL (ref 0.35–5.50)

## 2021-07-25 MED ORDER — POLYETHYLENE GLYCOL 3350 17 GM/SCOOP PO POWD: 17.0000 g | Freq: Every day | ORAL | 1 refills | Status: DC

## 2021-07-25 MED ORDER — ALBUTEROL SULFATE HFA 108 (90 BASE) MCG/ACT IN AERS: 2.0000 | INHALATION_SPRAY | RESPIRATORY_TRACT | 3 refills | Status: DC | PRN

## 2021-07-25 MED ORDER — MONTELUKAST SODIUM 10 MG PO TABS: 10.0000 mg | ORAL_TABLET | Freq: Every day | ORAL | 1 refills | Status: DC

## 2021-07-25 NOTE — Assessment & Plan Note (Signed)
Chronic, stable.  She was on medication for this in the past, however is not currently taking any.  She states that overall her anxiety is well controlled right now.  Her PHQ-9 was a 6 and her GAD-7 is a 4.  Follow-up with any concerns or worsening symptoms. ?

## 2021-07-25 NOTE — Patient Instructions (Addendum)
It was great to see you! ? ?Start singluair (montelukast) once a day at bedtime for allergies. I have refilled your albuterol inhaler.  ? ?Start miralax once a day, you can mix this in water, juice, tea, coffee. You can also increase your water and fiber intake.  ? ?We are checking your labs today and will let you know the results via mychart/phone.  ? ?Let's follow-up in 3 months, sooner if you have concerns. ? ?If a referral was placed today, you will be contacted for an appointment. Please note that routine referrals can sometimes take up to 3-4 weeks to process. Please call our office if you haven't heard anything after this time frame. ? ?Take care, ? ?Rodman Pickle, NP ? ?

## 2021-07-25 NOTE — Assessment & Plan Note (Signed)
She has a history of chronic constipation, that is ongoing.  She states that she has bowel movements maybe once a week sometimes every other week.  She does endorse some bloating and left lower quadrant abdominal pain at times.  We will check CMP, CBC today.  Encouraged her to start drinking more fluids, increase amount of fiber in her diet, and start taking MiraLAX daily.  Follow-up in 3 months or sooner with more concerns ?

## 2021-07-25 NOTE — Assessment & Plan Note (Signed)
Chronic, stable.  She states that her symptoms are worse when the pollen is out, and with dust which she encounters at her job daily.  She has not had to use her albuterol inhaler at all this week, however she states that she still has some nasal congestion and rhinorrhea.  She has been taking Zyrtec daily, however this does not fully help her in her symptoms.  We will start montelukast 10 mg daily at bedtime.  Albuterol refill sent to her pharmacy.  Overall her symptoms are still intermittent, do not recommend a daily maintenance inhaler at this time. ?

## 2021-08-15 ENCOUNTER — Ambulatory Visit (INDEPENDENT_AMBULATORY_CARE_PROVIDER_SITE_OTHER): Payer: 59 | Admitting: Advanced Practice Midwife

## 2021-08-15 ENCOUNTER — Encounter: Payer: Self-pay | Admitting: Advanced Practice Midwife

## 2021-08-15 ENCOUNTER — Other Ambulatory Visit (HOSPITAL_COMMUNITY)
Admission: RE | Admit: 2021-08-15 | Discharge: 2021-08-15 | Disposition: A | Discharge status: Home / Self Care | Payer: 59 | Source: Ambulatory Visit | Attending: Advanced Practice Midwife | Admitting: Advanced Practice Midwife

## 2021-08-15 VITALS — BP 110/73 | HR 78 | Ht 60.0 in | Wt 176.4 lb

## 2021-08-15 DIAGNOSIS — Z113 Encounter for screening for infections with a predominantly sexual mode of transmission: Secondary | ICD-10-CM

## 2021-08-15 DIAGNOSIS — Z3009 Encounter for other general counseling and advice on contraception: Secondary | ICD-10-CM

## 2021-08-15 DIAGNOSIS — N939 Abnormal uterine and vaginal bleeding, unspecified: Secondary | ICD-10-CM

## 2021-08-15 DIAGNOSIS — Z124 Encounter for screening for malignant neoplasm of cervix: Secondary | ICD-10-CM

## 2021-08-15 DIAGNOSIS — Z01419 Encounter for gynecological examination (general) (routine) without abnormal findings: Secondary | ICD-10-CM

## 2021-08-15 NOTE — Progress Notes (Signed)
Patient presents for AEX. ?Does not desire contraception. ?Denies vaginal symptoms, urinary symptoms.  ?Had PAP in [place], but doesn't have records. Desires PAP today.  ?

## 2021-08-15 NOTE — Progress Notes (Addendum)
? ?  Subjective:  ?  ? Kellie Padilla is a 26 y.o. female here at [facility] for a routine exam.  Current complaints: none. Menses have been more regular, less cramping recently with diet and exercise changes.  She is using withdrawal for contraception and is happy with the method.  Personal health questionnaire reviewed: yes. ? ?Do you have a primary care provider? yes ?Do you feel safe at home? yes ? ?[place] Office Visit from 08/15/2021 in [place]  ?PHQ-2 Total Score 0  ? ?  ? ? ?Health Maintenance Due  ?Topic Date Due  ? COVID-19 Vaccine (1) Never done  ? HPV VACCINES (1 - 2-dose series) Never done  ? TETANUS/TDAP  Never done  ? PAP-Cervical Cytology Screening  Never done  ? PAP SMEAR-Modifier  Never done  ?  ? ?Risk factors for chronic health problems: ?Smoking: ?Alchohol/how much: ?Pt BMI: Body mass index is 34.45 kg/m?. ?  ?Gynecologic History ?Patient's last menstrual period was 07/25/2021. ?Contraception: coitus interruptus ?Last Pap: 2021 in [place] but pt tried to obtain records but office closed.  Pt prefers to repeat Pap today in our office. Results were: normal ?Last mammogram: n/a.  ? ?Obstetric History ?OB History  ?Gravida Para Term Preterm AB Living  ?0 0 0 0 0 0  ?SAB IAB Ectopic Multiple Live Births  ?0 0 0 0 0  ? ? ? ?The following portions of the patient's history were reviewed and updated as appropriate: allergies, current medications, past family history, past medical history, past social history, past surgical history, and problem list. ? ?Review of Systems ?Pertinent items noted in HPI and remainder of comprehensive ROS otherwise negative.  ?  ?Objective:  ? ?BP 110/73   Pulse 78   Ht 5' (1.524 m)   Wt 176 lb 6.4 oz (80 kg)   LMP 07/25/2021   BMI 34.45 kg/m?  ?VS reviewed, nursing note reviewed,  ?Constitutional: well developed, well nourished, no distress ?HEENT: normocephalic ?CV: normal rate ?Pulm/chest wall: normal effort ?Breast Exam:  With shared  decision making, exam performed: right breast normal without mass, skin or nipple changes or axillary nodes, left breast normal without mass, skin or nipple changes or axillary nodes ?Abdomen: soft ?Neuro: alert and oriented x 3 ?Skin: warm, dry ?Psych: affect normal ?Pelvic exam: Performed: Cervix pink, visually closed, without lesion, scant white creamy discharge, vaginal walls and external genitalia normal ?Bimanual exam: Cervix 0/long/high, firm, anterior, neg CMT, uterus nontender, nonenlarged, adnexa without tenderness, enlargement, or mass  ? ? ?   ?Assessment/Plan:  ? ?1. Screening for cervical cancer ? ?- Cytology - PAP( [place]) ? ?2. Routine screening for STI (sexually transmitted infection) ? ?- Cervicovaginal ancillary only( [place]) ?- HIV antibody (with reflex) ?- RPR ?- Hepatitis B Surface AntiGEN ?- Hepatitis C Antibody ? ?3. Well woman exam with routine gynecological exam ? ? ?4. Abnormal uterine bleeding (AUB) ?--Improved with recent diet and exercise changes, menses monthly and pain is improved. ?  ?5. Encounter for counseling regarding contraception ?--Discussed pt contraceptive plans and reviewed contraceptive methods based on pt preferences and effectiveness.  Reviewed non-hormonal contraceptive options, including vaginal gel and diaphram.  Pt prefers to continue withdrawal method at this time.  ? ?Return in about 1 year (around 08/16/2022) for annual exam.  ? ?Fatima Blank, CNM ?10:02 AM   ?

## 2021-08-16 ENCOUNTER — Other Ambulatory Visit: Payer: Self-pay | Admitting: Nurse Practitioner

## 2021-08-16 LAB — CERVICOVAGINAL ANCILLARY ONLY
Chlamydia: NEGATIVE
Comment: NEGATIVE
Comment: NEGATIVE
Comment: NORMAL
Neisseria Gonorrhea: NEGATIVE
Trichomonas: NEGATIVE

## 2021-08-16 NOTE — Telephone Encounter (Signed)
Chart supports rx refill ?Last ov: 07/25/21 ?Last refill: 07/25/21 ?

## 2021-08-18 LAB — CYTOLOGY - PAP
Adequacy: ABSENT
Diagnosis: NEGATIVE

## 2021-09-04 ENCOUNTER — Other Ambulatory Visit: Payer: Self-pay | Admitting: Nurse Practitioner

## 2021-09-05 NOTE — Telephone Encounter (Signed)
Chart supports rx refill ?Last ov: 07/25/21 ?Last refill: 07/25/21 ?

## 2021-09-13 ENCOUNTER — Telehealth: Payer: Self-pay | Admitting: Nurse Practitioner

## 2021-09-13 NOTE — Telephone Encounter (Signed)
Pt called and stated that the medication prescribed for her allergies is not strong enough. Her face is still swelling and her eyes itch badly.

## 2021-09-14 MED ORDER — PREDNISONE 10 MG PO TABS: ORAL_TABLET | ORAL | 0 refills | Status: DC

## 2021-09-14 NOTE — Telephone Encounter (Signed)
Called and informed pat of provider instructions and recommendations. Pt voiced understanding. Sw, cma

## 2021-10-24 ENCOUNTER — Telehealth: Payer: Self-pay | Admitting: Nurse Practitioner

## 2021-10-24 ENCOUNTER — Ambulatory Visit: Payer: 59 | Admitting: Nurse Practitioner

## 2021-10-24 NOTE — Telephone Encounter (Signed)
NS letter sent.

## 2021-11-10 NOTE — Telephone Encounter (Signed)
1st no show, fee waived, letter sent 

## 2022-02-10 ENCOUNTER — Ambulatory Visit (HOSPITAL_COMMUNITY)
Admission: EM | Admit: 2022-02-10 | Discharge: 2022-02-10 | Disposition: A | Discharge status: Home / Self Care | Payer: 59 | Attending: Emergency Medicine | Admitting: Emergency Medicine

## 2022-02-10 ENCOUNTER — Encounter (HOSPITAL_COMMUNITY): Payer: Self-pay | Admitting: *Deleted

## 2022-02-10 ENCOUNTER — Other Ambulatory Visit: Payer: Self-pay

## 2022-02-10 DIAGNOSIS — J4521 Mild intermittent asthma with (acute) exacerbation: Secondary | ICD-10-CM

## 2022-02-10 DIAGNOSIS — B349 Viral infection, unspecified: Secondary | ICD-10-CM

## 2022-02-10 MED ORDER — ALBUTEROL SULFATE (2.5 MG/3ML) 0.083% IN NEBU
2.5000 mg | INHALATION_SOLUTION | Freq: Once | RESPIRATORY_TRACT | Status: AC
  Administered 2022-02-10: 2.5 mg via RESPIRATORY_TRACT

## 2022-02-10 MED ORDER — ONDANSETRON 4 MG PO TBDP
ORAL_TABLET | ORAL | Status: AC
  Filled 2022-02-10: qty 1

## 2022-02-10 MED ORDER — PREDNISONE 10 MG PO TABS: ORAL_TABLET | ORAL | 0 refills | Status: DC

## 2022-02-10 MED ORDER — ONDANSETRON HCL 4 MG PO TABS: 4.0000 mg | ORAL_TABLET | Freq: Four times a day (QID) | ORAL | 0 refills | Status: DC

## 2022-02-10 MED ORDER — ALBUTEROL SULFATE (2.5 MG/3ML) 0.083% IN NEBU
INHALATION_SOLUTION | RESPIRATORY_TRACT | Status: AC
  Filled 2022-02-10: qty 3

## 2022-02-10 MED ORDER — ONDANSETRON 4 MG PO TBDP
4.0000 mg | ORAL_TABLET | Freq: Once | ORAL | Status: AC
  Administered 2022-02-10: 4 mg via ORAL

## 2022-02-10 MED ORDER — FLUTICASONE PROPIONATE 50 MCG/ACT NA SUSP: 2.0000 | Freq: Two times a day (BID) | NASAL | 2 refills | Status: DC

## 2022-02-10 MED ORDER — CETIRIZINE HCL 10 MG PO TABS: 10.0000 mg | ORAL_TABLET | Freq: Every day | ORAL | 0 refills | Status: AC

## 2022-02-10 MED ORDER — METHYLPREDNISOLONE SODIUM SUCC 125 MG IJ SOLR
INTRAMUSCULAR | Status: AC
  Filled 2022-02-10: qty 2

## 2022-02-10 MED ORDER — METHYLPREDNISOLONE SODIUM SUCC 125 MG IJ SOLR
80.0000 mg | Freq: Once | INTRAMUSCULAR | Status: AC
Start: 2022-02-10 — End: 2022-02-10
  Administered 2022-02-10: 80 mg via INTRAMUSCULAR

## 2022-02-10 NOTE — ED Provider Notes (Signed)
[facility]    CSN: [id number] Arrival date & time: 02/10/22  1454      History   Chief Complaint Chief Complaint  Patient presents with   Cough   Wheezing    HPI Kellie Padilla is a 26 y.o. female.  Patient complaining of shortness of breath, productive cough, and wheezing x 2 to 3 days.  Patient states that onset of symptoms began with a scratchy throat.  Patient reports fatigue.  Patient reports shortness of breath upon exertion.  Patient reports one of her family members had a cold that she was exposed to before onset of symptoms.  History of Asthma. Patient has used inhaler within no relief of symptoms.    Cough Associated symptoms: chills, rhinorrhea, shortness of breath and wheezing   Associated symptoms: no ear pain, no fever and no myalgias   Wheezing Associated symptoms: chest tightness, cough, fatigue, rhinorrhea and shortness of breath   Associated symptoms: no ear pain, no fever and no stridor     Past Medical History:  Diagnosis Date   Allergy    Anxiety    Asthma    Irregular menstrual cycle     Patient Active Problem List   Diagnosis Date Noted   Mild intermittent asthma without complication 40/98/1191   Constipation 07/25/2021   Anxiety 07/25/2021    History reviewed. No pertinent surgical history.  OB History     Gravida  0   Para  0   Term  0   Preterm  0   AB  0   Living  0      SAB  0   IAB  0   Ectopic  0   Multiple  0   Live Births  0            Home Medications    Prior to Admission medications   Medication Sig Start Date End Date Taking? Authorizing Provider  albuterol (VENTOLIN HFA) 108 (90 Base) MCG/ACT inhaler Inhale 2 puffs into the lungs every 4 (four) hours as needed for wheezing or shortness of breath. 07/25/21   McElwee, Scheryl Darter, NP  cetirizine (ZYRTEC ALLERGY) 10 MG tablet Take 1 tablet (10 mg total) by mouth daily. Patient taking differently: Take 10 mg by mouth daily as needed for  allergies. 10/15/17   Petrucelli, Samantha R, PA-C  montelukast (SINGULAIR) 10 MG tablet TAKE 1 TABLET BY MOUTH EVERYDAY AT BEDTIME 09/05/21   McElwee, Lauren A, NP  polyethylene glycol powder (GLYCOLAX/MIRALAX) 17 GM/SCOOP powder Take 17 g by mouth daily. Patient not taking: Reported on 08/15/2021 07/25/21   Charyl Dancer, NP  predniSONE (DELTASONE) 10 MG tablet Take 6 tablets today, then 5 tablets tomorrow, then decrease by 1 tablet every day until gone 09/14/21   McElwee, Scheryl Darter, NP    Family History Family History  Problem Relation Age of Onset   Healthy Mother    Healthy Father    Thyroid disease Sister    Cancer Maternal Grandmother        breast   Cancer Maternal Grandfather        lung    Social History Social History   Tobacco Use   Smoking status: Never    Passive exposure: Yes   Smokeless tobacco: Never  Vaping Use   Vaping Use: Never used  Substance Use Topics   Alcohol use: Yes    Comment: socially   Drug use: No     Allergies   Dust mite  extract, Pollen extract, and Shellfish allergy   Review of Systems Review of Systems  Constitutional:  Positive for activity change, appetite change, chills and fatigue. Negative for fever.  HENT:  Positive for congestion and rhinorrhea. Negative for ear discharge, ear pain, postnasal drip, sinus pressure, sinus pain and trouble swallowing.        Throat irritation. Denies painful swallowing.   Eyes: Negative.   Respiratory:  Positive for cough, chest tightness, shortness of breath and wheezing. Negative for apnea, choking and stridor.   Cardiovascular: Negative.   Gastrointestinal:  Positive for nausea. Negative for abdominal pain, diarrhea and vomiting.  Musculoskeletal:  Negative for myalgias.     Physical Exam Triage Vital Signs ED Triage Vitals  Enc Vitals Group     BP 02/10/22 1516 104/71     Pulse Rate 02/10/22 1516 (!) 124     Resp 02/10/22 1516 20     Temp 02/10/22 1516 99.3 F (37.4 C)     Temp src --       SpO2 02/10/22 1516 94 %     Weight --      Height --      Head Circumference --      Peak Flow --      Pain Score 02/10/22 1513 10     Pain Loc --      Pain Edu? --      Excl. in [place]? --    No data found.  Updated Vital Signs BP 104/71   Pulse (!) 124   Temp 99.3 F (37.4 C)   Resp 20   LMP 01/26/2022   SpO2 94%   Physical Exam Vitals and nursing note reviewed.  HENT:     Right Ear: Hearing, tympanic membrane, ear canal and external ear normal.     Left Ear: Hearing, tympanic membrane, ear canal and external ear normal.     Nose: Congestion and rhinorrhea present. Rhinorrhea is clear.     Right Turbinates: Enlarged and swollen. Not pale.     Left Turbinates: Enlarged and swollen. Not pale.     Right Sinus: No maxillary sinus tenderness or frontal sinus tenderness.     Left Sinus: No maxillary sinus tenderness or frontal sinus tenderness.     Mouth/Throat:     Mouth: Mucous membranes are moist.     Pharynx: Posterior oropharyngeal erythema present. No pharyngeal swelling, oropharyngeal exudate or uvula swelling.     Tonsils: No tonsillar exudate or tonsillar abscesses. 0 on the right. 0 on the left.  Cardiovascular:     Rate and Rhythm: Normal rate and regular rhythm.     Heart sounds: Normal heart sounds, S1 normal and S2 normal.  Pulmonary:     Effort: Tachypnea present.     Breath sounds: Examination of the left-upper field reveals wheezing. Examination of the left-middle field reveals wheezing. Examination of the right-lower field reveals decreased breath sounds. Examination of the left-lower field reveals decreased breath sounds. Decreased breath sounds and wheezing present.  Lymphadenopathy:     Cervical: No cervical adenopathy.      UC Treatments / Results  Labs (all labs ordered are listed, but only abnormal results are displayed) Labs Reviewed - No data to display  EKG   Radiology No results found.  Procedures Procedures (including critical care  time)  Medications Ordered in UC Medications  albuterol (PROVENTIL) (2.5 MG/3ML) 0.083% nebulizer solution 2.5 mg (2.5 mg Nebulization Given 02/10/22 1554)  methylPREDNISolone sodium succinate (SOLU-MEDROL) 125 mg/2  mL injection 80 mg (80 mg Intramuscular Given 02/10/22 1549)  ondansetron (ZOFRAN-ODT) disintegrating tablet 4 mg (4 mg Oral Given 02/10/22 1549)    Initial Impression / Assessment and Plan / UC Course  I have reviewed the triage vital signs and the nursing notes.  Pertinent labs & imaging results that were available during my care of the patient were reviewed by me and considered in my medical decision making (see chart for details).     Patient was treated for asthma exacerbation and viral illness.  Solu-Medrol, albuterol nebulizer, and Zofran given in office.  Final Clinical Impressions(s) / UC Diagnoses   Final diagnoses:  None   Discharge Instructions   None    ED Prescriptions   None    PDMP not reviewed this encounter.

## 2022-02-10 NOTE — ED Triage Notes (Signed)
Pt reports SHOB , wheezing and cough. Pt reports a Hx of asthma and has been using HNN at home with out relief of SHOB.

## 2022-02-10 NOTE — Discharge Instructions (Addendum)
Prednisone taper has been sent to the pharmacy, please start taking this medication tomorrow morning for 6 days.  Flonase has been sent to the pharmacy for nasal congestion, you will place 2 sprays in each nostril 2 times daily, once in the morning and once in the evening.  Zyrtec has been sent to the pharmacy, this is an antihistamine, you will take this medication 1 time daily until symptoms resolve.  Zofran has been sent to the pharmacy to help with nausea, you can take this every 6 hours as needed for nausea.  We gave you 1 dose while you are in office today.  Please use your inhaler every 6 hours while you are awake over the next 4 days.   If you begin having any severe shortness of breath, worsening chest tightness, chest pain, lightheadedness, or any new or worsening symptoms please go to the nearest emergency department.

## 2022-05-28 ENCOUNTER — Ambulatory Visit (HOSPITAL_COMMUNITY)
Admission: EM | Admit: 2022-05-28 | Discharge: 2022-05-28 | Disposition: A | Discharge status: Home / Self Care | Payer: Self-pay | Attending: Emergency Medicine | Admitting: Emergency Medicine

## 2022-05-28 ENCOUNTER — Other Ambulatory Visit: Payer: Self-pay

## 2022-05-28 ENCOUNTER — Encounter (HOSPITAL_COMMUNITY): Payer: Self-pay | Admitting: Emergency Medicine

## 2022-05-28 DIAGNOSIS — J069 Acute upper respiratory infection, unspecified: Secondary | ICD-10-CM

## 2022-05-28 DIAGNOSIS — J4520 Mild intermittent asthma, uncomplicated: Secondary | ICD-10-CM

## 2022-05-28 DIAGNOSIS — Z3202 Encounter for pregnancy test, result negative: Secondary | ICD-10-CM

## 2022-05-28 LAB — POC URINE PREG, ED: Preg Test, Ur: NEGATIVE

## 2022-05-28 MED ORDER — IPRATROPIUM-ALBUTEROL 0.5-2.5 (3) MG/3ML IN SOLN
RESPIRATORY_TRACT | Status: AC
  Filled 2022-05-28: qty 3

## 2022-05-28 MED ORDER — GUAIFENESIN ER 600 MG PO TB12: 600.0000 mg | ORAL_TABLET | Freq: Two times a day (BID) | ORAL | 0 refills | Status: DC

## 2022-05-28 MED ORDER — ALBUTEROL SULFATE HFA 108 (90 BASE) MCG/ACT IN AERS: 2.0000 | INHALATION_SPRAY | Freq: Four times a day (QID) | RESPIRATORY_TRACT | 0 refills | Status: AC | PRN

## 2022-05-28 MED ORDER — IPRATROPIUM-ALBUTEROL 0.5-2.5 (3) MG/3ML IN SOLN
3.0000 mL | Freq: Once | RESPIRATORY_TRACT | Status: AC
  Administered 2022-05-28: 3 mL via RESPIRATORY_TRACT

## 2022-05-28 NOTE — ED Triage Notes (Signed)
Reports sob 2-3 days ago.  Patient noticed wheezing when lying down.  Patient reports runny nose, cough.    When asked about having an inhaler, patient questioned if she did.  Then said she did, but said she wasn't using it.  Said "when I get like that, I just lay down"  Patient reports taking zyrtec.    Requesting a pregnancy test.  January 4 was LMP.  Patient is not on any birth control.  Home test was faulty-"no lines came up"

## 2022-05-28 NOTE — ED Provider Notes (Signed)
Kellie Padilla    CSN: [id number] Arrival date & time: 05/28/22  [id number]      History   Chief Complaint Chief Complaint  Patient presents with   Asthma    HPI Kellie Padilla is a 27 y.o. female.  Presents with 3 day history of shortness of breath Reports wheezing when laying flat. Some chest tightness Runny nose and cough No fevers Hx asthma, has not used inhaler Took zyrtec  Additionally requesting pregnancy test. LMP 1/4  Past Medical History:  Diagnosis Date   Allergy    Anxiety    Asthma    Irregular menstrual cycle     Patient Active Problem List   Diagnosis Date Noted   Mild intermittent asthma without complication [id number]   Constipation 07/25/2021   Anxiety 07/25/2021    History reviewed. No pertinent surgical history.  OB History     Gravida  0   Para  0   Term  0   Preterm  0   AB  0   Living  0      SAB  0   IAB  0   Ectopic  0   Multiple  0   Live Births  0            Home Medications    Prior to Admission medications   Medication Sig Start Date End Date Taking? Authorizing Provider  albuterol (VENTOLIN HFA) 108 (90 Base) MCG/ACT inhaler Inhale 2 puffs into the lungs every 6 (six) hours as needed. 05/28/22  Yes Rupa Lagan, Wells Guiles, PA-C  guaiFENesin (MUCINEX) 600 MG 12 hr tablet Take 1 tablet (600 mg total) by mouth 2 (two) times daily. 05/28/22  Yes Keeton Kassebaum, Wells Guiles, PA-C  cetirizine (ZYRTEC ALLERGY) 10 MG tablet Take 1 tablet (10 mg total) by mouth daily. 02/10/22   Flossie Dibble, NP    Family History Family History  Problem Relation Age of Onset   Healthy Mother    Healthy Father    Thyroid disease Sister    Cancer Maternal Grandmother        breast   Cancer Maternal Grandfather        lung    Social History Social History   Tobacco Use   Smoking status: Never    Passive exposure: Yes   Smokeless tobacco: Never  Vaping Use   Vaping Use: Never used  Substance Use Topics   Alcohol use: Yes     Comment: socially   Drug use: No     Allergies   Dust mite extract, Pollen extract, and Shellfish allergy   Review of Systems Review of Systems As per HPI  Physical Exam Triage Vital Signs ED Triage Vitals  Enc Vitals Group     BP 05/28/22 0833 118/78     Pulse Rate 05/28/22 0833 (!) 110     Resp 05/28/22 0833 (!) 24     Temp 05/28/22 0833 98.6 F (37 C)     Temp Source 05/28/22 0833 Oral     SpO2 05/28/22 0833 95 %     Weight --      Height --      Head Circumference --      Peak Flow --      Pain Score 05/28/22 0831 5     Pain Loc --      Pain Edu? --      Excl. in [place]? --    No data found.  Updated Vital Signs BP 113/79 (BP Location:  Right Arm)   Pulse (!) 123   Temp 98.3 F (36.8 C)   Resp 16   LMP 04/12/2022 (Exact Date)   SpO2 98%    Physical Exam Vitals and nursing note reviewed.  Constitutional:      General: She is not in acute distress.    Comments: No acute distress, speaks in full sentences   HENT:     Nose: Congestion present. No rhinorrhea.     Mouth/Throat:     Mouth: Mucous membranes are moist.     Pharynx: Oropharynx is clear. No posterior oropharyngeal erythema.  Eyes:     Conjunctiva/sclera: Conjunctivae normal.  Cardiovascular:     Rate and Rhythm: Normal rate and regular rhythm.     Pulses: Normal pulses.     Heart sounds: Normal heart sounds.  Pulmonary:     Effort: Pulmonary effort is normal. No respiratory distress.     Breath sounds: Normal breath sounds. No wheezing.     Comments: Clear throughout  Musculoskeletal:     Cervical back: Normal range of motion.  Lymphadenopathy:     Cervical: No cervical adenopathy.  Skin:    General: Skin is warm and dry.  Neurological:     Mental Status: She is alert and oriented to person, place, and time.     UC Treatments / Results  Labs (all labs ordered are listed, but only abnormal results are displayed) Labs Reviewed  POC URINE PREG, ED    EKG   Radiology No results  found.  Procedures Procedures (including critical care time)  Medications Ordered in UC Medications  ipratropium-albuterol (DUONEB) 0.5-2.5 (3) MG/3ML nebulizer solution 3 mL (3 mLs Nebulization Given 05/28/22 0903)    Initial Impression / Assessment and Plan / UC Course  I have reviewed the triage vital signs and the nursing notes.  Pertinent labs & imaging results that were available during my care of the patient were reviewed by me and considered in my medical decision making (see chart for details).  UPT negative   Afebrile, well appearing. Sating well on room air. Lungs are clear without wheezing. Duoneb treatment per patient request - improved  Sent albuterol inhaler, q6 hours prn. Mucinex BID  No indication for steroids today Return precautions discussed. Patient agrees to plan  Final Clinical Impressions(s) / UC Diagnoses   Final diagnoses:  Viral URI with cough  Mild intermittent asthma without complication     Discharge Instructions      Please use inhaler every 6 hours for the next several days. Then continue as needed. Continue once daily zyrtec Take mucinex twice daily. Take with lots of fluids  Please return if symptoms worsen. Follow with primary care if needed     ED Prescriptions     Medication Sig Dispense Auth. Provider   albuterol (VENTOLIN HFA) 108 (90 Base) MCG/ACT inhaler Inhale 2 puffs into the lungs every 6 (six) hours as needed. 18 g Cordarryl Monrreal, PA-C   guaiFENesin (MUCINEX) 600 MG 12 hr tablet Take 1 tablet (600 mg total) by mouth 2 (two) times daily. 20 tablet Adama Ivins, Wells Guiles, PA-C      PDMP not reviewed this encounter.   Les Pou, [place] 05/28/22 [id number]

## 2022-05-28 NOTE — ED Notes (Signed)
States she cannot give any urine right now, but maybe after drinking water.  Given water

## 2022-05-28 NOTE — Discharge Instructions (Addendum)
Please use inhaler every 6 hours for the next several days. Then continue as needed. Continue once daily zyrtec Take mucinex twice daily. Take with lots of fluids  Please return if symptoms worsen. Follow with primary care if needed

## 2023-03-13 ENCOUNTER — Other Ambulatory Visit: Payer: Self-pay

## 2023-03-13 ENCOUNTER — Emergency Department (HOSPITAL_COMMUNITY): Payer: BLUE CROSS/BLUE SHIELD

## 2023-03-13 ENCOUNTER — Emergency Department (HOSPITAL_COMMUNITY)
Admission: EM | Admit: 2023-03-13 | Discharge: 2023-03-13 | Disposition: A | Discharge status: Home / Self Care | Payer: BLUE CROSS/BLUE SHIELD | Attending: Emergency Medicine | Admitting: Emergency Medicine

## 2023-03-13 ENCOUNTER — Encounter (HOSPITAL_COMMUNITY): Payer: Self-pay

## 2023-03-13 DIAGNOSIS — Z20822 Contact with and (suspected) exposure to covid-19: Secondary | ICD-10-CM | POA: Insufficient documentation

## 2023-03-13 DIAGNOSIS — J45909 Unspecified asthma, uncomplicated: Secondary | ICD-10-CM | POA: Insufficient documentation

## 2023-03-13 DIAGNOSIS — R0602 Shortness of breath: Secondary | ICD-10-CM | POA: Diagnosis present

## 2023-03-13 DIAGNOSIS — J069 Acute upper respiratory infection, unspecified: Secondary | ICD-10-CM | POA: Diagnosis not present

## 2023-03-13 LAB — RESP PANEL BY RT-PCR (RSV, FLU A&B, COVID)  RVPGX2
Influenza A by PCR: NEGATIVE
Influenza B by PCR: NEGATIVE
Resp Syncytial Virus by PCR: NEGATIVE
SARS Coronavirus 2 by RT PCR: NEGATIVE

## 2023-03-13 MED ORDER — ONDANSETRON 4 MG PO TBDP
4.0000 mg | ORAL_TABLET | Freq: Once | ORAL | Status: AC
  Administered 2023-03-13: 4 mg via ORAL
  Filled 2023-03-13: qty 1

## 2023-03-13 MED ORDER — ALBUTEROL SULFATE HFA 108 (90 BASE) MCG/ACT IN AERS
2.0000 | INHALATION_SPRAY | Freq: Once | RESPIRATORY_TRACT | Status: AC
  Administered 2023-03-13: 2 via RESPIRATORY_TRACT
  Filled 2023-03-13: qty 6.7

## 2023-03-13 MED ORDER — PREDNISONE 20 MG PO TABS
60.0000 mg | ORAL_TABLET | Freq: Once | ORAL | Status: AC
  Administered 2023-03-13: 60 mg via ORAL
  Filled 2023-03-13: qty 3

## 2023-03-13 MED ORDER — IPRATROPIUM-ALBUTEROL 0.5-2.5 (3) MG/3ML IN SOLN
3.0000 mL | Freq: Once | RESPIRATORY_TRACT | Status: AC
  Administered 2023-03-13: 3 mL via RESPIRATORY_TRACT
  Filled 2023-03-13: qty 3

## 2023-03-13 MED ORDER — PREDNISONE 10 MG PO TABS: 40.0000 mg | ORAL_TABLET | Freq: Every day | ORAL | 0 refills | Status: AC

## 2023-03-13 MED ORDER — ALBUTEROL SULFATE HFA 108 (90 BASE) MCG/ACT IN AERS: 1.0000 | INHALATION_SPRAY | Freq: Four times a day (QID) | RESPIRATORY_TRACT | 1 refills | Status: AC | PRN

## 2023-03-13 NOTE — ED Provider Notes (Signed)
[facility] EMERGENCY DEPARTMENT AT [facility] Provider Note   CSN: [id number] Arrival date & time: 03/13/23  0118     History  Chief Complaint  Patient presents with   Shortness of Breath    Kellie Padilla is a 27 y.o. female presenting for department shortness of breath.  Patient reports a history of asthma and feels she has had an upper respiratory illness for several days, nasal congestion, feel she is getting short of breath.  Does not use an inhaler at home  HPI     Home Medications Prior to Admission medications   Medication Sig Start Date End Date Taking? Authorizing Provider  albuterol (VENTOLIN HFA) 108 (90 Base) MCG/ACT inhaler Inhale 1-2 puffs into the lungs every 6 (six) hours as needed for wheezing or shortness of breath. 03/13/23  Yes Mathan Darroch, Kermit Balo, MD  predniSONE (DELTASONE) 10 MG tablet Take 4 tablets (40 mg total) by mouth daily with breakfast for 4 days. 03/14/23 03/18/23 Yes Salem Lembke, Kermit Balo, MD  albuterol (VENTOLIN HFA) 108 (90 Base) MCG/ACT inhaler Inhale 2 puffs into the lungs every 6 (six) hours as needed. 05/28/22   Rising, Lurena Joiner, PA-C  cetirizine (ZYRTEC ALLERGY) 10 MG tablet Take 1 tablet (10 mg total) by mouth daily. 02/10/22   Debby Freiberg, NP  guaiFENesin (MUCINEX) 600 MG 12 hr tablet Take 1 tablet (600 mg total) by mouth 2 (two) times daily. 05/28/22   Rising, Lurena Joiner, PA-C      Allergies    Dust mite extract, Pollen extract, and Shellfish allergy    Review of Systems   Review of Systems  Physical Exam Updated Vital Signs BP 110/75 (BP Location: Left Arm)   Pulse 82   Temp (!) 97.5 F (36.4 C) (Axillary)   Resp 16   Ht 5' (1.524 m)   Wt 81.6 kg   LMP 02/24/2023   SpO2 97%   BMI 35.15 kg/m  Physical Exam Constitutional:      General: She is not in acute distress.    Comments: Nasal congestion  HENT:     Head: Normocephalic and atraumatic.  Eyes:     Conjunctiva/sclera: Conjunctivae normal.     Pupils: Pupils are  equal, round, and reactive to light.  Cardiovascular:     Rate and Rhythm: Normal rate and regular rhythm.  Pulmonary:     Effort: Pulmonary effort is normal. No respiratory distress.     Breath sounds: No wheezing.  Abdominal:     General: There is no distension.     Tenderness: There is no abdominal tenderness.  Skin:    General: Skin is warm and dry.  Neurological:     General: No focal deficit present.     Mental Status: She is alert. Mental status is at baseline.  Psychiatric:        Mood and Affect: Mood normal.        Behavior: Behavior normal.     ED Results / Procedures / Treatments   Labs (all labs ordered are listed, but only abnormal results are displayed) Labs Reviewed  RESP PANEL BY RT-PCR (RSV, FLU A&B, COVID)  [id number]    EKG EKG Interpretation Date/Time:  Wednesday March 13 2023 01:39:14 EST Ventricular Rate:  94 PR Interval:  152 QRS Duration:  73 QT Interval:  325 QTC Calculation: 407 R Axis:   57  Text Interpretation: Sinus rhythm Confirmed by Alvester Chou [phone]) on 03/13/2023 4:26:44 AM  Radiology DG Chest 2 View  Result  Date: 03/13/2023 CLINICAL DATA:  Head and nasal congestion with shortness of breath. EXAM: CHEST - 2 VIEW COMPARISON:  April 07, 2018 FINDINGS: The heart size and mediastinal contours are within normal limits. Both lungs are clear. The visualized skeletal structures are unremarkable. IMPRESSION: No active cardiopulmonary disease. Electronically Signed   By: Aram Candela M.D.   On: 03/13/2023 01:57    Procedures Procedures    Medications Ordered in ED Medications  predniSONE (DELTASONE) tablet 60 mg (has no administration in time range)  albuterol (VENTOLIN HFA) 108 (90 Base) MCG/ACT inhaler 2 puff (has no administration in time range)  ipratropium-albuterol (DUONEB) 0.5-2.5 (3) MG/3ML nebulizer solution 3 mL (3 mLs Nebulization Given 03/13/23 0310)  ondansetron (ZOFRAN-ODT) disintegrating tablet 4 mg (4 mg Oral  Given 03/13/23 0310)    ED Course/ Medical Decision Making/ A&P                                 Medical Decision Making Amount and/or Complexity of Data Reviewed Radiology: ordered.  Risk Prescription drug management.   Patient is here shortness of breath in the setting of an upper respiratory infection.  She is a lot of nasal congestion on exam.  She is not wheezing or in any respiratory distress but was evaluated myself after receiving a DuoNeb treatment in triage.  She feels much better after treatment.  She is not requiring further treatment in the ED but would benefit from a short course of steroids, will also give her an inhaler to have at home.  I personally reviewed her workup including her viral testing which was negative for COVID and flu, as well as her chest x-ray which is unremarkable.  Her EKG per my interpretation does not show acute ischemic findings.  There is no indication for hospitalization.  The patient is stable for discharge        Final Clinical Impression(s) / ED Diagnoses Final diagnoses:  Viral URI    Rx / DC Orders ED Discharge Orders          Ordered    predniSONE (DELTASONE) 10 MG tablet  Daily with breakfast        03/13/23 0446    albuterol (VENTOLIN HFA) 108 (90 Base) MCG/ACT inhaler  Every 6 hours PRN        03/13/23 0446              Terald Sleeper, MD 03/13/23 [phone]

## 2023-03-13 NOTE — ED Triage Notes (Signed)
Pt reports that she has had congestion and SOB x 2 days. Pt has asthma but is out of her home inhaler.

## 2023-03-13 NOTE — ED Notes (Signed)
Pt reports that she is having increased SOB. O2 sats 100% on room air and provider notified and orders entered.

## 2023-05-13 ENCOUNTER — Other Ambulatory Visit: Payer: Self-pay

## 2023-05-13 ENCOUNTER — Emergency Department (HOSPITAL_COMMUNITY)
Admission: EM | Admit: 2023-05-13 | Discharge: 2023-05-13 | Disposition: A | Discharge status: Home / Self Care | Payer: Medicaid - Out of State | Attending: Emergency Medicine | Admitting: Emergency Medicine

## 2023-05-13 DIAGNOSIS — J45909 Unspecified asthma, uncomplicated: Secondary | ICD-10-CM | POA: Diagnosis not present

## 2023-05-13 DIAGNOSIS — R1030 Lower abdominal pain, unspecified: Secondary | ICD-10-CM | POA: Diagnosis present

## 2023-05-13 DIAGNOSIS — Z113 Encounter for screening for infections with a predominantly sexual mode of transmission: Secondary | ICD-10-CM | POA: Diagnosis not present

## 2023-05-13 LAB — URINALYSIS, ROUTINE W REFLEX MICROSCOPIC
Bilirubin Urine: NEGATIVE
Glucose, UA: NEGATIVE mg/dL
Hgb urine dipstick: NEGATIVE
Ketones, ur: NEGATIVE mg/dL
Nitrite: NEGATIVE
Protein, ur: NEGATIVE mg/dL
Specific Gravity, Urine: 1.018 (ref 1.005–1.030)
pH: 5 (ref 5.0–8.0)

## 2023-05-13 LAB — WET PREP, GENITAL
Clue Cells Wet Prep HPF POC: NONE SEEN
Sperm: NONE SEEN
Trich, Wet Prep: NONE SEEN
WBC, Wet Prep HPF POC: <10 (ref <10)
Yeast Wet Prep HPF POC: NONE SEEN

## 2023-05-13 LAB — HIV ANTIBODY (ROUTINE TESTING W REFLEX): HIV Screen 4th Generation wRfx: NONREACTIVE

## 2023-05-13 LAB — PREGNANCY, URINE: Preg Test, Ur: NEGATIVE

## 2023-05-13 LAB — RPR: RPR Ser Ql: NONREACTIVE

## 2023-05-13 NOTE — Discharge Instructions (Signed)
Please follow-up with your primary care provider in regards to recent symptoms and ER visit.  Today your labs and imaging are reassuring however you will need to follow-up in the MyChart app in regards to the rest of the STD screening.  If you test positive please contact the health department or your primary care provider for antibiotics.  In meantime you may take Tylenol every 6 hours needed for pain.  If symptoms change or worsen please return to the ER.

## 2023-05-13 NOTE — ED Triage Notes (Signed)
Pt. Stated, Kellie Padilla had lower abdominal pain on and off for the last 3 days. Its more like pressure when Im sitting.

## 2023-05-13 NOTE — ED Provider Notes (Signed)
[place] EMERGENCY DEPARTMENT AT [facility] Provider Note   CSN: [id number] Arrival date & time: 05/13/23  4098     History  Chief Complaint  Patient presents with   Abdominal Pain    Kellie Padilla is a 28 y.o. female history of anxiety, irregular menstrual cycle, asthma presented with lower abdominal pain for the past 3 days.  Patient denies fevers nausea vomiting being unable to eat as she states she has been able to eat and drink without issue.  Patient tells her appendix and denies any vaginal discharge or abnormal uterine bleeding.  Patient denies any flank pain or new onset weakness or numbness.  Patient states that she has a pressure in her vaginal area when she sits down but denies any dysuria.  Patient states that she would like to be tested for STDs including syphilis and HIV.  Home Medications Prior to Admission medications   Medication Sig Start Date End Date Taking? Authorizing Provider  albuterol (VENTOLIN HFA) 108 (90 Base) MCG/ACT inhaler Inhale 2 puffs into the lungs every 6 (six) hours as needed. 05/28/22   Rising, Lurena Joiner, PA-C  albuterol (VENTOLIN HFA) 108 (90 Base) MCG/ACT inhaler Inhale 1-2 puffs into the lungs every 6 (six) hours as needed for wheezing or shortness of breath. 03/13/23   Terald Sleeper, MD  cetirizine (ZYRTEC ALLERGY) 10 MG tablet Take 1 tablet (10 mg total) by mouth daily. 02/10/22   Debby Freiberg, NP  guaiFENesin (MUCINEX) 600 MG 12 hr tablet Take 1 tablet (600 mg total) by mouth 2 (two) times daily. 05/28/22   Rising, Lurena Joiner, PA-C      Allergies    Dust mite extract, Pollen extract, and Shellfish allergy    Review of Systems   Review of Systems  Gastrointestinal:  Positive for abdominal pain.    Physical Exam Updated Vital Signs BP 105/84   Pulse 67   Temp 97.9 F (36.6 C) (Oral)   Resp 17   Ht 5' (1.524 m)   Wt 88.9 kg   LMP 04/21/2023   SpO2 100%   BMI 38.28 kg/m  Physical Exam Vitals reviewed.   Constitutional:      General: She is not in acute distress. HENT:     Head: Normocephalic and atraumatic.  Eyes:     Extraocular Movements: Extraocular movements intact.     Conjunctiva/sclera: Conjunctivae normal.     Pupils: Pupils are equal, round, and reactive to light.  Cardiovascular:     Rate and Rhythm: Normal rate and regular rhythm.     Pulses: Normal pulses.     Heart sounds: Normal heart sounds.     Comments: 2+ bilateral radial/dorsalis pedis pulses with regular rate Pulmonary:     Effort: Pulmonary effort is normal. No respiratory distress.     Breath sounds: Normal breath sounds.  Abdominal:     Palpations: Abdomen is soft.     Tenderness: There is abdominal tenderness (Mild tenderness to lower abdominal area mostly in the suprapubic region). There is no guarding or rebound. Negative signs include Murphy's sign, Rovsing's sign, McBurney's sign and psoas sign.  Musculoskeletal:        General: Normal range of motion.     Cervical back: Normal range of motion and neck supple.     Comments: 5 out of 5 bilateral grip/leg extension strength  Skin:    General: Skin is warm and dry.     Capillary Refill: Capillary refill takes less than 2 seconds.  Neurological:     General: No focal deficit present.     Mental Status: She is alert and oriented to person, place, and time.     Comments: Sensation intact in all 4 limbs  Psychiatric:        Mood and Affect: Mood normal.     ED Results / Procedures / Treatments   Labs (all labs ordered are listed, but only abnormal results are displayed) Labs Reviewed  URINALYSIS, ROUTINE W REFLEX MICROSCOPIC - Abnormal; Notable for the following components:      Result Value   APPearance HAZY (*)    Leukocytes,Ua TRACE (*)    Bacteria, UA RARE (*)    All other components within normal limits  WET PREP, GENITAL  PREGNANCY, URINE  RPR  HIV ANTIBODY (ROUTINE TESTING W REFLEX)  POC URINE PREG, ED  GC/CHLAMYDIA PROBE AMP ([facility]) NOT AT [facility]    EKG None  Radiology No results found.  Procedures Procedures    Medications Ordered in ED Medications - No data to display  ED Course/ Medical Decision Making/ A&P                                 Medical Decision Making Amount and/or Complexity of Data Reviewed Labs: ordered.   Mauro Kaufmann 27 y.o. presented today for abdominal pain.  Working DDx that I considered at this time includes, but not limited to, gastroenteritis, colitis, small bowel obstruction, appendicitis, cholecystitis, hepatobiliary pathology, gastritis, PUD, ACS, aortic dissection, diverticulosis/diverticulitis, pancreatitis, nephrolithiasis, medication induced, AAA, UTI, pyelonephritis, ruptured ectopic pregnancy, PID, ovarian torsion.  R/o DDx: gastroenteritis, colitis, small bowel obstruction, appendicitis, cholecystitis, hepatobiliary pathology, gastritis, PUD, ACS, aortic dissection, diverticulosis/diverticulitis, pancreatitis, nephrolithiasis, medication induced, AAA, UTI, pyelonephritis, ruptured ectopic pregnancy, PID, ovarian torsion: These are considered less likely due to history of present illness, physical exam, labs/imaging findings.  Review of prior external notes: 03/13/2023 ED  Unique Tests and My Independent Interpretation:  UA: Unremarkable RPR: Pending GC chlamydia: Pending HIV: Pending POC urine pregnancy: negative Wet mount: Unremarkable  Social Determinants of Health: none  Discussion with Independent Historian:  Significant other  Discussion of Management of Tests: None  Risk: Low: based on diagnostic testing/clinical impression and treatment plan  Risk Stratification Score: None  Plan: On exam patient was in no acute distress with stable vitals.  Patient did have mild lower abdominal tenderness on exam mostly in the suprapubic region but was not endorsing any systemic symptoms.  I offered blood labs to further assess however patient declined at this  time as she states he wants be tested for STDs.  Patient states that she would like to be tested for syphilis and HIV as well and so we will get these.  Patient does not want to be prophylactically treated and wants to follow-up on the results in the outpatient setting.  Will get wet mount and if negative will discharge however if positive will give appropriate medications have her follow-up with her primary care provider.  Patient does understand that not doing a further assessment for abdominal pain may lead to missed diagnoses/complications and patient with full decision made capacity verbalized her understanding acceptance of this.  I offered a pelvic exam to assess for possible vaginal prolapse as patient does endorse pressure when she sits down however patient declined at this time and states she would rather do a self swab and follow-up with her OB/GYN which is reasonable.  Wet mount is negative.  Paramedic Rountree stated that the POC urine pregnancy was negative however has not been flipped in the system yet.  Patient was encouraged to follow-up with the health department along with her primary care provider and follow-up on the MyChart results which she verbalized her agreement to.  Once patient's POC urine pregnancy is negative patient can be discharged.  Urine preg negative.  Will discharge.  Patient was given return precautions. Patient stable for discharge at this time.  Patient verbalized understanding of plan.  This chart was dictated using voice recognition software.  Despite best efforts to proofread,  errors can occur which can change the documentation meaning.         Final Clinical Impression(s) / ED Diagnoses Final diagnoses:  Screening for STD (sexually transmitted disease)    Rx / DC Orders ED Discharge Orders     None         Remi Deter 05/13/23 1338    Gerhard Munch, MD 05/13/23 1520

## 2023-05-14 ENCOUNTER — Telehealth: Payer: Self-pay

## 2023-05-14 NOTE — Transitions of Care (Post Inpatient/ED Visit) (Signed)
   05/14/2023  Name: Kellie Padilla MRN: [id number] DOB: [birth year]  Today's TOC FU Call Status: Today's TOC FU Call Status:: Unsuccessful Call (1st Attempt) Unsuccessful Call (1st Attempt) Date: 05/14/23  Attempted to reach the patient regarding the most recent Inpatient/ED visit.  Follow Up Plan: Additional outreach attempts will be made to reach the patient to complete the Transitions of Care (Post Inpatient/ED visit) call.   Signature Dedra Sorenson, BSN, [facility]

## 2023-05-15 NOTE — Transitions of Care (Post Inpatient/ED Visit) (Signed)
   05/15/2023  Name: Kellie Padilla MRN: [id number] DOB: 1996-02-22  Today's TOC FU Call Status: Today's TOC FU Call Status:: Successful TOC FU Call Completed Unsuccessful Call (1st Attempt) Date: 05/14/23 [facility] FU Call Complete Date: 05/15/23 Patient's Name and Date of Birth confirmed.  Transition Care Management Follow-up Telephone Call Date of Discharge: 05/13/23 Discharge Facility: [facility]) Type of Discharge: Emergency Department How have you been since you were released from the hospital?: Better Any questions or concerns?: No  Items Reviewed: Did you receive and understand the discharge instructions provided?: No Medications obtained,verified, and reconciled?: Yes (Medications Reviewed) Any new allergies since your discharge?: No Dietary orders reviewed?: NA Do you have support at home?: Yes  Medications Reviewed Today: Medications Reviewed Today   Medications were not reviewed in this encounter     Home Care and Equipment/Supplies: Were Home Health Services Ordered?: NA Any new equipment or medical supplies ordered?: NA  Functional Questionnaire: Do you need assistance with bathing/showering or dressing?: No Do you need assistance with meal preparation?: No Do you need assistance with eating?: No Do you have difficulty maintaining continence: No Do you need assistance with getting out of bed/getting out of a chair/moving?: No Do you have difficulty managing or taking your medications?: No  Follow up appointments reviewed: PCP Follow-up appointment confirmed?: NA Specialist Hospital Follow-up appointment confirmed?: NA Do you need transportation to your follow-up appointment?: No Do you understand care options if your condition(s) worsen?: Yes-patient verbalized understanding    SIGNATURE Dedra Sorenson, BSN, RN

## 2023-06-05 ENCOUNTER — Ambulatory Visit (INDEPENDENT_AMBULATORY_CARE_PROVIDER_SITE_OTHER): Payer: Self-pay | Admitting: General Practice

## 2023-06-05 VITALS — BP 139/78 | HR 94 | Ht 61.0 in | Wt 201.0 lb

## 2023-06-05 DIAGNOSIS — Z3201 Encounter for pregnancy test, result positive: Secondary | ICD-10-CM | POA: Diagnosis not present

## 2023-06-05 DIAGNOSIS — Z3200 Encounter for pregnancy test, result unknown: Secondary | ICD-10-CM

## 2023-06-05 LAB — POCT URINE PREGNANCY: Preg Test, Ur: POSITIVE — AB

## 2023-06-05 NOTE — Progress Notes (Signed)
 Ms. Dorow presents today for UPT. She has no unusual complaints. LMP: 04-21-23    OBJECTIVE: Appears well, in no apparent distress.  OB History     Gravida  0   Para  0   Term  0   Preterm  0   AB  0   Living  0      SAB  0   IAB  0   Ectopic  0   Multiple  0   Live Births  0          Home UPT Result: Positive  In-Office UPT result: Positive  I have reviewed the patient's medical, obstetrical, social, and family histories, and medications.   ASSESSMENT: Positive pregnancy test  PLAN Prenatal care to be completed at: [facility]

## 2023-06-19 ENCOUNTER — Other Ambulatory Visit: Payer: Self-pay | Admitting: *Deleted

## 2023-06-19 ENCOUNTER — Encounter: Payer: Self-pay | Admitting: *Deleted

## 2023-06-20 ENCOUNTER — Ambulatory Visit (INDEPENDENT_AMBULATORY_CARE_PROVIDER_SITE_OTHER): Payer: Self-pay

## 2023-06-20 ENCOUNTER — Other Ambulatory Visit (HOSPITAL_COMMUNITY)
Admission: RE | Admit: 2023-06-20 | Discharge: 2023-06-20 | Disposition: A | Discharge status: Home / Self Care | Source: Ambulatory Visit | Attending: Obstetrics & Gynecology | Admitting: Obstetrics & Gynecology

## 2023-06-20 ENCOUNTER — Ambulatory Visit (INDEPENDENT_AMBULATORY_CARE_PROVIDER_SITE_OTHER): Payer: Self-pay | Admitting: *Deleted

## 2023-06-20 VITALS — BP 123/79 | HR 73 | Wt 195.3 lb

## 2023-06-20 DIAGNOSIS — Z3480 Encounter for supervision of other normal pregnancy, unspecified trimester: Secondary | ICD-10-CM | POA: Insufficient documentation

## 2023-06-20 DIAGNOSIS — O3680X0 Pregnancy with inconclusive fetal viability, not applicable or unspecified: Secondary | ICD-10-CM

## 2023-06-20 DIAGNOSIS — Z3A01 Less than 8 weeks gestation of pregnancy: Secondary | ICD-10-CM

## 2023-06-20 DIAGNOSIS — Z3401 Encounter for supervision of normal first pregnancy, first trimester: Secondary | ICD-10-CM | POA: Diagnosis not present

## 2023-06-20 DIAGNOSIS — Z1339 Encounter for screening examination for other mental health and behavioral disorders: Secondary | ICD-10-CM | POA: Diagnosis not present

## 2023-06-20 MED ORDER — BLOOD PRESSURE KIT DEVI: 1.0000 | 0 refills | Status: AC

## 2023-06-20 NOTE — Patient Instructions (Signed)

## 2023-06-20 NOTE — Progress Notes (Signed)
 New OB Intake  I connected with Kellie Padilla  on 06/20/23 at  8:15 AM EDT by In Person Visit and verified that I am speaking with the correct person using two identifiers. Nurse is located at [facility] and pt is located at [place].  I discussed the limitations, risks, security and privacy concerns of performing an evaluation and management service by telephone and the availability of in person appointments. I also discussed with the patient that there may be a patient responsible charge related to this service. The patient expressed understanding and agreed to proceed.  I explained I am completing New OB Intake today. We discussed EDD of 01/26/2024, by Last Menstrual Period. Pt is G1P0000. I reviewed her allergies, medications and Medical/Surgical/OB history.    Patient Active Problem List   Diagnosis Date Noted   Mild intermittent asthma without complication 07/25/2021   Constipation 07/25/2021   Anxiety 07/25/2021    Concerns addressed today  Delivery Plans Plans to deliver at [facility]. Discussed the nature of our practice with multiple providers including residents and students. Due to the size of the practice, the delivering provider may not be the same as those providing prenatal care.   Patient is not interested in water birth. Offered upcoming OB visit with CNM to discuss further.  MyChart/Babyscripts MyChart access verified. I explained pt will have some visits in office and some virtually. Babyscripts instructions given and order placed. Patient verifies receipt of registration text/e-mail. Account successfully created and app downloaded. If patient is a candidate for Optimized scheduling, add to sticky note.   Blood Pressure Cuff/Weight Scale Patient has private insurance; instructed to purchase blood pressure cuff and bring to first prenatal appt. Explained after first prenatal appt pt will check weekly and document in Babyscripts. Patient does not have weight scale; patient may  purchase if they desire to track weight weekly in Babyscripts.  Anatomy US Explained first scheduled [place] will be around 19 weeks. Anatomy US scheduled for TBD at TBD.  Interested in [place]? If yes, send referral and doula dot phrase.   Is patient a candidate for Babyscripts Optimization? Yes, patient accepted    First visit review I reviewed new OB appt with patient. Explained pt will be seen by Chrystine Oiler, PA at first visit. Discussed Avelina Laine genetic screening with patient. Requests Panor[employer] and Horizon.. Routine prenatal labs  OB Urine and GC/CC only collected at today's visit. Initial OB labs deferred to New OB appt.    Last Pap Diagnosis  Date Value Ref Range Status  08/15/2021      - Negative for intraepithelial lesion or malignancy (NILM)    Harrel Lemon, RN 06/20/2023  8:22 AM

## 2023-06-21 LAB — CERVICOVAGINAL ANCILLARY ONLY
Chlamydia: NEGATIVE
Comment: NEGATIVE
Comment: NORMAL
Neisseria Gonorrhea: NEGATIVE

## 2023-07-01 ENCOUNTER — Encounter: Payer: Self-pay | Admitting: Obstetrics

## 2023-07-10 ENCOUNTER — Encounter: Payer: Self-pay | Admitting: Nurse Practitioner

## 2023-07-10 ENCOUNTER — Ambulatory Visit (INDEPENDENT_AMBULATORY_CARE_PROVIDER_SITE_OTHER): Payer: Medicaid - Out of State | Admitting: Nurse Practitioner

## 2023-07-10 VITALS — BP 102/66 | HR 77 | Temp 97.3°F | Ht 61.0 in | Wt 203.0 lb

## 2023-07-10 DIAGNOSIS — Z3A09 9 weeks gestation of pregnancy: Secondary | ICD-10-CM | POA: Diagnosis not present

## 2023-07-10 DIAGNOSIS — Z0000 Encounter for general adult medical examination without abnormal findings: Secondary | ICD-10-CM | POA: Diagnosis not present

## 2023-07-10 DIAGNOSIS — J4520 Mild intermittent asthma, uncomplicated: Secondary | ICD-10-CM | POA: Diagnosis not present

## 2023-07-10 DIAGNOSIS — J302 Other seasonal allergic rhinitis: Secondary | ICD-10-CM | POA: Diagnosis not present

## 2023-07-10 NOTE — Progress Notes (Signed)
 BP 102/66 (BP Location: Left Arm, Patient Position: Sitting, Cuff Size: Normal)   Pulse 77   Temp (!) 97.3 F (36.3 C)   Ht 5\' 1"  (1.549 m)   Wt 203 lb (92.1 kg)   LMP 04/21/2023 (Exact Date)   SpO2 100%   BMI 38.36 kg/m    Subjective:    Patient ID: Kellie Padilla, female    DOB: [birth year], 28 y.o.   MRN: [id number]  CC: Chief Complaint  Patient presents with   Annual Exam    Would like allergy testing    HPI: Kellie Padilla is a 28 y.o. female presenting on 07/10/2023 for comprehensive medical examination. Current medical complaints include: allergy testing   She is currently [redacted] weeks pregnant. She is following with OBGYN. She is doing well and her nausea/vomiting has improved over the last few days. She notes that she was told she had a reaction to shrimp when she was younger, but is craving shrimp with this pregnancy. She would like allergy testing to verify it was the shrimp she had a reaction to.   She currently lives with: alone Menopausal Symptoms: no  Depression and Anxiety Screen done today and results listed below:     07/10/2023    9:22 AM 06/20/2023    8:37 AM 08/15/2021    9:35 AM 07/25/2021   11:07 AM  Depression screen PHQ 2/9  Decreased Interest 0 0 0 1  Down, Depressed, Hopeless 0 0 0 0  PHQ - 2 Score 0 0 0 1  Altered sleeping 1 0 0 1  Tired, decreased energy 2 0 0 2  Change in appetite 0 0 0 1  Feeling bad or failure about yourself  0 0 0 1  Trouble concentrating 0 0 0 0  Moving slowly or fidgety/restless 0 0 0 0  Suicidal thoughts 0   0  PHQ-9 Score 3 0 0 6  Difficult doing work/chores Not difficult at all   Not difficult at all      07/10/2023    9:22 AM 06/20/2023    8:38 AM 08/15/2021    9:37 AM 07/25/2021   11:07 AM  GAD 7 : Generalized Anxiety Score  Nervous, Anxious, on Edge 1 0 0 1  Control/stop worrying 0 0 1 1  Worry too much - different things 2 0 1   Trouble relaxing 1 0 0   Restless 0 0 1 0  Easily annoyed or irritable 1 1 2 1   Afraid -  awful might happen 0 0 1 1  Total GAD 7 Score 5 1 6    Anxiety Difficulty Not difficult at all       The patient does not have a history of falls. I did not complete a risk assessment for falls. A plan of care for falls was not documented.   Past Medical History:  Past Medical History:  Diagnosis Date   Allergy    Anxiety    Asthma    Irregular menstrual cycle     Surgical History:  Past Surgical History:  Procedure Laterality Date   NO PAST SURGERIES      Medications:  Current Outpatient Medications on File Prior to Visit  Medication Sig   albuterol (VENTOLIN HFA) 108 (90 Base) MCG/ACT inhaler Inhale 1-2 puffs into the lungs every 6 (six) hours as needed for wheezing or shortness of breath.   Blood Pressure Monitoring (BLOOD PRESSURE KIT) DEVI 1 Device by Does not apply route once a week.  cetirizine (ZYRTEC ALLERGY) 10 MG tablet Take 1 tablet (10 mg total) by mouth daily.   Prenatal Vit-Fe Fumarate-FA (PRENATAL MULTIVITAMIN) TABS tablet Take 1 tablet by mouth daily at 12 noon.   albuterol (VENTOLIN HFA) 108 (90 Base) MCG/ACT inhaler Inhale 2 puffs into the lungs every 6 (six) hours as needed. (Patient not taking: Reported on 07/10/2023)   guaiFENesin (MUCINEX) 600 MG 12 hr tablet Take 1 tablet (600 mg total) by mouth 2 (two) times daily. (Patient not taking: Reported on 07/10/2023)   No current facility-administered medications on file prior to visit.    Allergies:  Allergies  Allergen Reactions   Dust Mite Extract Anaphylaxis   Pollen Extract Anaphylaxis   Shellfish Allergy     Social History:  Social History   Socioeconomic History   Marital status: Single    Spouse name: Not on file   Number of children: Not on file   Years of education: Not on file   Highest education level: Not on file  Occupational History   Not on file  Tobacco Use   Smoking status: Never    Passive exposure: Yes   Smokeless tobacco: Never  Vaping Use   Vaping status: Never Used   Substance and Sexual Activity   Alcohol use: Not Currently    Comment: socially   Drug use: No   Sexual activity: Yes    Partners: Male    Birth control/protection: None  Other Topics Concern   Not on file  Social History Narrative   Not on file   Social Drivers of Health   Financial Resource Strain: Not on file  Food Insecurity: Not on file  Transportation Needs: Not on file  Physical Activity: Not on file  Stress: Not on file  Social Connections: Not on file  Intimate Partner Violence: Not on file   Social History   Tobacco Use  Smoking Status Never   Passive exposure: Yes  Smokeless Tobacco Never   Social History   Substance and Sexual Activity  Alcohol Use Not Currently   Comment: socially    Family History:  Family History  Problem Relation Age of Onset   Healthy Mother    Healthy Father    Thyroid disease Sister    Cancer Maternal Grandmother        breast   Cancer Maternal Grandfather        lung    Past medical history, surgical history, medications, allergies, family history and social history reviewed with patient today and changes made to appropriate areas of the chart.   Review of Systems  Constitutional: Negative.   HENT:  Positive for congestion. Negative for ear pain and tinnitus.   Eyes: Negative.   Respiratory: Negative.    Cardiovascular: Negative.   Gastrointestinal:  Positive for nausea and vomiting. Negative for abdominal pain, blood in stool, constipation and diarrhea.  Genitourinary: Negative.   Musculoskeletal: Negative.   Skin: Negative.   Neurological: Negative.   Psychiatric/Behavioral: Negative.     All other ROS negative except what is listed above and in the HPI.      Objective:    BP 102/66 (BP Location: Left Arm, Patient Position: Sitting, Cuff Size: Normal)   Pulse 77   Temp (!) 97.3 F (36.3 C)   Ht 5\' 1"  (1.549 m)   Wt 203 lb (92.1 kg)   LMP 04/21/2023 (Exact Date)   SpO2 100%   BMI 38.36 kg/m   Wt  Readings from Last 3 Encounters:  07/10/23 203 lb (92.1 kg)  06/20/23 195 lb 4.8 oz (88.6 kg)  06/05/23 201 lb (91.2 kg)    Physical Exam Vitals and nursing note reviewed.  Constitutional:      General: She is not in acute distress.    Appearance: Normal appearance.  HENT:     Head: Normocephalic and atraumatic.     Right Ear: Tympanic membrane, ear canal and external ear normal.     Left Ear: Tympanic membrane, ear canal and external ear normal.     Mouth/Throat:     Mouth: Mucous membranes are moist.     Pharynx: No posterior oropharyngeal erythema.  Eyes:     Conjunctiva/sclera: Conjunctivae normal.  Cardiovascular:     Rate and Rhythm: Normal rate and regular rhythm.     Pulses: Normal pulses.     Heart sounds: Normal heart sounds.  Pulmonary:     Effort: Pulmonary effort is normal.     Breath sounds: Normal breath sounds.  Abdominal:     Palpations: Abdomen is soft.     Tenderness: There is no abdominal tenderness.  Musculoskeletal:        General: Normal range of motion.     Cervical back: Normal range of motion and neck supple.     Right lower leg: No edema.     Left lower leg: No edema.  Lymphadenopathy:     Cervical: No cervical adenopathy.  Skin:    General: Skin is warm and dry.  Neurological:     General: No focal deficit present.     Mental Status: She is alert and oriented to person, place, and time.     Cranial Nerves: No cranial nerve deficit.     Coordination: Coordination normal.     Gait: Gait normal.  Psychiatric:        Mood and Affect: Mood normal.        Behavior: Behavior normal.        Thought Content: Thought content normal.        Judgment: Judgment normal.     Results for orders placed or performed in visit on 06/20/23  Cervicovaginal ancillary only   Collection Time: 06/20/23  8:48 AM  Result Value Ref Range   Neisseria Gonorrhea Negative    Chlamydia Negative    Comment Normal Reference Ranger Chlamydia - Negative    Comment       Normal Reference Range Neisseria Gonorrhea - Negative      Assessment & Plan:   Problem List Items Addressed This Visit       Respiratory   Mild intermittent asthma without complication   Chronic, ongoing. Symptoms have worsened with pollen out. Continue cetirizine 10mg  daily and albuterol inhaler as needed.         Other   Routine general medical examination at a health care facility - Primary   Health maintenance reviewed and updated. Discussed nutrition, exercise. Follow-up 1 year.        Seasonal allergies   She has been experiencing worsening rhinorrhea with pollen. Continue cetirizine 10mg  daily and albuterol inhaler as needed. She also was told she had an allergic reaction to shrimp when she was younger, but is craving shrimp with the pregnancy. She would like allergy testing to verify if she is allergic to shrimp. Food allergy panel ordered.       Relevant Orders   Food Allergy Profile   Other Visit Diagnoses       [redacted] weeks gestation of pregnancy  Continue collaboration and recommendations from OBGYN.        Follow up plan: Return in about 1 year (around 07/09/2024) for CPE.   LABORATORY TESTING:  - Pap smear: up to date  IMMUNIZATIONS:   - Tdap: Tetanus vaccination status reviewed: will get at OBGYN. - Influenza: Postponed to flu season - Pneumovax: Not applicable - Prevnar:  will defer until after pregnancy - HPV:  She states that she had them - Shingrix vaccine: Not applicable  SCREENING: -Mammogram: Declined  - Colonoscopy: Not applicable  - Bone Density: Not applicable   PATIENT COUNSELING:   Advised to take 1 mg of folate supplement per day if capable of pregnancy.   Sexuality: Discussed sexually transmitted diseases, partner selection, use of condoms, avoidance of unintended pregnancy  and contraceptive alternatives.   Advised to avoid cigarette smoking.  I discussed with the patient that most people either abstain from alcohol or  drink within safe limits (<=14/week and <=4 drinks/occasion for males, <=7/weeks and <= 3 drinks/occasion for females) and that the risk for alcohol disorders and other health effects rises proportionally with the number of drinks per week and how often a drinker exceeds daily limits.  Discussed cessation/primary prevention of drug use and availability of treatment for abuse.   Diet: Encouraged to adjust caloric intake to maintain  or achieve ideal body weight, to reduce intake of dietary saturated fat and total fat, to limit sodium intake by avoiding high sodium foods and not adding table salt, and to maintain adequate dietary potassium and calcium preferably from fresh fruits, vegetables, and low-fat dairy products.    stressed the importance of regular exercise  Injury prevention: Discussed safety belts, safety helmets, smoke detector, smoking near bedding or upholstery.   Dental health: Discussed importance of regular tooth brushing, flossing, and dental visits.    NEXT PREVENTATIVE PHYSICAL DUE IN 1 YEAR. Return in about 1 year (around 07/09/2024) for CPE.  Dekisha Mesmer A Wess Baney

## 2023-07-10 NOTE — Assessment & Plan Note (Addendum)
 She has been experiencing worsening rhinorrhea with pollen. Continue cetirizine 10mg  daily and albuterol inhaler as needed. She also was told she had an allergic reaction to shrimp when she was younger, but is craving shrimp with the pregnancy. She would like allergy testing to verify if she is allergic to shrimp. Food allergy panel ordered.

## 2023-07-10 NOTE — Assessment & Plan Note (Signed)
 Chronic, ongoing. Symptoms have worsened with pollen out. Continue cetirizine 10mg  daily and albuterol inhaler as needed.

## 2023-07-10 NOTE — Assessment & Plan Note (Signed)
Health maintenance reviewed and updated. Discussed nutrition, exercise. Follow-up 1 year.

## 2023-07-10 NOTE — Patient Instructions (Signed)
 It was great to see you!  We are checking your labs today and will let you know the results via mychart/phone.   Keep taking the prenatal and following with OBGYN  Alternate water and gatorade/powerade/pedialyte  Let's follow-up in 1 year, sooner if you have concerns.  If a referral was placed today, you will be contacted for an appointment. Please note that routine referrals can sometimes take up to 3-4 weeks to process. Please call our office if you haven't heard anything after this time frame.  Take care,  Rodman Pickle, NP

## 2023-07-11 ENCOUNTER — Encounter: Payer: Self-pay | Admitting: Nurse Practitioner

## 2023-07-11 LAB — FOOD ALLERGY PROFILE
Allergen, Salmon, f41: <0.1 kU/L
Almonds: <0.1 kU/L
Brazil Nut: <0.1 kU/L
CLASS: 0
CLASS: 0
CLASS: 0
CLASS: 0
CLASS: 0
CLASS: 1
CLASS: 2
CLASS: 2
Cashew IgE: <0.1 kU/L
Class: 0
Class: 0
Class: 0
Class: 3
Egg White IgE: <0.1 kU/L
Fish Cod: <0.1 kU/L
Hazelnut: 0.44 kU/L — ABNORMAL HIGH
Macadamia Nut: 0.3 kU/L — ABNORMAL HIGH
Milk IgE: <0.1 kU/L
Peanut IgE: 0.2 kU/L — ABNORMAL HIGH
Scallop IgE: 1.95 kU/L — ABNORMAL HIGH
Sesame Seed f10: 0.24 kU/L — ABNORMAL HIGH
Shrimp IgE: 12.4 kU/L — ABNORMAL HIGH
Soybean IgE: <0.1 kU/L
Tuna IgE: 0.11 kU/L — ABNORMAL HIGH
Walnut: 1.3 kU/L — ABNORMAL HIGH
Wheat IgE: 0.16 kU/L — ABNORMAL HIGH

## 2023-07-11 LAB — INTERPRETATION:

## 2023-07-23 DIAGNOSIS — O99210 Obesity complicating pregnancy, unspecified trimester: Secondary | ICD-10-CM | POA: Insufficient documentation

## 2023-07-24 ENCOUNTER — Ambulatory Visit: Payer: Self-pay | Admitting: Obstetrics and Gynecology

## 2023-07-24 VITALS — BP 107/74 | HR 80 | Wt 202.0 lb

## 2023-07-24 DIAGNOSIS — O99341 Other mental disorders complicating pregnancy, first trimester: Secondary | ICD-10-CM

## 2023-07-24 DIAGNOSIS — Z6837 Body mass index (BMI) 37.0-37.9, adult: Secondary | ICD-10-CM | POA: Diagnosis not present

## 2023-07-24 DIAGNOSIS — O99511 Diseases of the respiratory system complicating pregnancy, first trimester: Secondary | ICD-10-CM | POA: Diagnosis not present

## 2023-07-24 DIAGNOSIS — O99210 Obesity complicating pregnancy, unspecified trimester: Secondary | ICD-10-CM

## 2023-07-24 DIAGNOSIS — Z3A21 21 weeks gestation of pregnancy: Secondary | ICD-10-CM | POA: Diagnosis not present

## 2023-07-24 DIAGNOSIS — O99211 Obesity complicating pregnancy, first trimester: Secondary | ICD-10-CM

## 2023-07-24 DIAGNOSIS — Z3480 Encounter for supervision of other normal pregnancy, unspecified trimester: Secondary | ICD-10-CM

## 2023-07-24 DIAGNOSIS — Z3A11 11 weeks gestation of pregnancy: Secondary | ICD-10-CM | POA: Diagnosis not present

## 2023-07-24 DIAGNOSIS — J45909 Unspecified asthma, uncomplicated: Secondary | ICD-10-CM | POA: Diagnosis not present

## 2023-07-24 DIAGNOSIS — J4520 Mild intermittent asthma, uncomplicated: Secondary | ICD-10-CM | POA: Diagnosis not present

## 2023-07-24 DIAGNOSIS — Z1339 Encounter for screening examination for other mental health and behavioral disorders: Secondary | ICD-10-CM | POA: Diagnosis not present

## 2023-07-24 DIAGNOSIS — F419 Anxiety disorder, unspecified: Secondary | ICD-10-CM | POA: Insufficient documentation

## 2023-07-24 DIAGNOSIS — Z3A30 30 weeks gestation of pregnancy: Secondary | ICD-10-CM | POA: Diagnosis not present

## 2023-07-24 MED ORDER — ASPIRIN 81 MG PO TBEC: 81.0000 mg | DELAYED_RELEASE_TABLET | Freq: Every day | ORAL | 2 refills | Status: AC

## 2023-07-24 NOTE — Progress Notes (Signed)
 NOB

## 2023-07-24 NOTE — Progress Notes (Signed)
 Chief Complaint  Patient presents with   Initial Prenatal Visit    Subjective:   Kellie Padilla is a 28 y.o. G1P0000 at [redacted]w[redacted]d by LMP c/w 6 wk ultrasound being seen today for her first obstetrical visit.  Her obstetrical history is significant for obesity. Patient does intend to breast feed. Pregnancy history fully reviewed.  Pregnancy complicated by: Asthma: occasional albuterol use Anxiety: mood stable, no meds currently Obesity: pregravid BMI 38  Patient reports no complaints.  HISTORY: OB History  Gravida Para Term Preterm AB Living  1 0 0 0 0 0  SAB IAB Ectopic Multiple Live Births  0 0 0 0 0    # Outcome Date GA Lbr Len/2nd Weight Sex Type Anes PTL Lv  1 Current              Last pap smear: Lab Results  Component Value Date   DIAGPAP  08/15/2021    - Negative for intraepithelial lesion or malignancy (NILM)     Past Medical History:  Diagnosis Date   Allergy    Anxiety    Asthma    Irregular menstrual cycle    Past Surgical History:  Procedure Laterality Date   NO PAST SURGERIES     Family History  Problem Relation Age of Onset   Healthy Mother    Healthy Father    Thyroid disease Sister    Cancer Maternal Grandmother        breast   Cancer Maternal Grandfather        lung   Social History   Tobacco Use   Smoking status: Never    Passive exposure: Yes   Smokeless tobacco: Never  Vaping Use   Vaping status: Never Used  Substance Use Topics   Alcohol use: Not Currently    Comment: socially   Drug use: No   Allergies  Allergen Reactions   Dust Mite Extract Anaphylaxis   Pollen Extract Anaphylaxis   Shellfish Allergy    Current Outpatient Medications on File Prior to Visit  Medication Sig Dispense Refill   albuterol (VENTOLIN HFA) 108 (90 Base) MCG/ACT inhaler Inhale 1-2 puffs into the lungs every 6 (six) hours as needed for wheezing or shortness of breath. 8 g 1   cetirizine (ZYRTEC ALLERGY) 10 MG tablet Take 1 tablet (10 mg total) by  mouth daily. 30 tablet 0   Prenatal Vit-Fe Fumarate-FA (PRENATAL MULTIVITAMIN) TABS tablet Take 1 tablet by mouth daily at 12 noon.     albuterol (VENTOLIN HFA) 108 (90 Base) MCG/ACT inhaler Inhale 2 puffs into the lungs every 6 (six) hours as needed. (Patient not taking: Reported on 07/10/2023) 18 g 0   Blood Pressure Monitoring (BLOOD PRESSURE KIT) DEVI 1 Device by Does not apply route once a week. 1 each 0   No current facility-administered medications on file prior to visit.     Exam   Vitals:   07/24/23 1016  BP: 107/74  Pulse: 80  Weight: 202 lb (91.6 kg)   Fetal Heart Rate (bpm): 160  General:  Alert, oriented and cooperative. Patient is in no acute distress.  Breast: deferred  Cardiovascular: RRR  Respiratory: CTAB  Abdomen: Soft, gravid, appropriate for gestational age.  Pain/Pressure: Absent     Pelvic: EGBUS within normal limits  Vagina normal with normal discharge Cervix closed without lesions Uterus appropriate for gestational age Adnexa without masses or tenderness        Extremities: Normal range of motion.  Edema: None  Mental Status: Normal mood and affect. Normal behavior. Normal judgment and thought content.    Assessment:   Pregnancy: G1P0000 Patient Active Problem List   Diagnosis Date Noted   Asthma during pregnancy 07/24/2023   Obesity in pregnancy 07/23/2023   Routine general medical examination at a health care facility 07/10/2023   Seasonal allergies 07/10/2023   Supervision of other normal pregnancy, antepartum 06/20/2023   Mild intermittent asthma without complication 07/25/2021   Constipation 07/25/2021   Anxiety 07/25/2021     Plan:  1. Supervision of other normal pregnancy, antepartum (Primary) Initial labs drawn. Continue prenatal vitamins. Genetic Screening discussed: NIPS, carrier screening and AFP, requested. Ultrasound discussed; fetal anatomic survey: requested. Problem list reviewed and updated. The nature of [place] [facility] with multiple MDs and other Advanced Practice Providers was explained to patient  Routine obstetric precautions reviewed. - CBC/D/Plt+RPR+Rh+ABO+RubIgG... - HgB A1c - PANORAMA PRENATAL TEST - HORIZON Basic Panel - Culture, OB Urine - aspirin EC 81 MG tablet; Take 1 tablet (81 mg total) by mouth at bedtime. Start taking when you are [redacted] weeks pregnant for rest of pregnancy for prevention of preeclampsia  Dispense: 300 tablet; Refill: 2  2. Obesity in pregnancy Baby aspirin recommended Goal weight gain 11-20 lbs Consider growth/antenatal testing in third trimester - aspirin EC 81 MG tablet; Take 1 tablet (81 mg total) by mouth at bedtime. Start taking when you are [redacted] weeks pregnant for rest of pregnancy for prevention of preeclampsia  Dispense: 300 tablet; Refill: 2  3. Asthma during pregnancy Rare inhaler use, has inhaler at home  4. Anxiety during pregnancy Mood appropriate, support given - Ambulatory referral to Integrated Behavioral Health    Return in about 4 weeks (around 08/21/2023).  Marci Setter, MD, FACOG Obstetrician & Gynecologist, [facility] for [facility], [facility]

## 2023-07-25 ENCOUNTER — Encounter: Payer: Self-pay | Admitting: Obstetrics and Gynecology

## 2023-07-25 ENCOUNTER — Telehealth: Payer: Self-pay

## 2023-07-25 LAB — CBC/D/PLT+RPR+RH+ABO+RUBIGG...
Antibody Screen: NEGATIVE
Basophils Absolute: 0 10*3/uL (ref 0.0–0.2)
Basos: 0 %
EOS (ABSOLUTE): 0.2 10*3/uL (ref 0.0–0.4)
Eos: 4 %
HCV Ab: NONREACTIVE
HIV Screen 4th Generation wRfx: NONREACTIVE
Hematocrit: 38 % (ref 34.0–46.6)
Hemoglobin: 12.5 g/dL (ref 11.1–15.9)
Hepatitis B Surface Ag: NEGATIVE
Immature Grans (Abs): 0 10*3/uL (ref 0.0–0.1)
Immature Granulocytes: 0 %
Lymphocytes Absolute: 1.7 10*3/uL (ref 0.7–3.1)
Lymphs: 30 %
MCH: 28.2 pg (ref 26.6–33.0)
MCHC: 32.9 g/dL (ref 31.5–35.7)
MCV: 86 fL (ref 79–97)
Monocytes Absolute: 0.6 10*3/uL (ref 0.1–0.9)
Monocytes: 10 %
Neutrophils Absolute: 3.1 10*3/uL (ref 1.4–7.0)
Neutrophils: 56 %
Platelets: 226 10*3/uL (ref 150–450)
RBC: 4.43 x10E6/uL (ref 3.77–5.28)
RDW: 13.6 % (ref 11.7–15.4)
RPR Ser Ql: NONREACTIVE
Rh Factor: POSITIVE
Rubella Antibodies, IGG: <0.9 {index} — ABNORMAL LOW (ref >0.99)
WBC: 5.6 10*3/uL (ref 3.4–10.8)

## 2023-07-25 LAB — HEMOGLOBIN A1C
Est. average glucose Bld gHb Est-mCnc: 114 mg/dL
Hgb A1c MFr Bld: 5.6 % (ref 4.8–5.6)

## 2023-07-25 LAB — HCV INTERPRETATION

## 2023-07-25 NOTE — Telephone Encounter (Signed)
 Called patient to schedule [facility] appointment. Left voicemail for her to call back.

## 2023-07-26 LAB — URINE CULTURE, OB REFLEX

## 2023-07-26 LAB — CULTURE, OB URINE

## 2023-07-30 ENCOUNTER — Encounter: Payer: Self-pay | Admitting: Obstetrics and Gynecology

## 2023-07-30 LAB — PANORAMA PRENATAL TEST FULL PANEL:PANORAMA TEST PLUS 5 ADDITIONAL MICRODELETIONS: FETAL FRACTION: 6.8

## 2023-08-05 LAB — HORIZON CUSTOM: REPORT SUMMARY: NEGATIVE

## 2023-08-21 ENCOUNTER — Ambulatory Visit

## 2023-08-21 VITALS — BP 103/69 | HR 85 | Wt 202.2 lb

## 2023-08-21 DIAGNOSIS — O99210 Obesity complicating pregnancy, unspecified trimester: Secondary | ICD-10-CM

## 2023-08-21 DIAGNOSIS — Z3480 Encounter for supervision of other normal pregnancy, unspecified trimester: Secondary | ICD-10-CM

## 2023-08-21 DIAGNOSIS — Z3A15 15 weeks gestation of pregnancy: Secondary | ICD-10-CM | POA: Diagnosis not present

## 2023-08-21 MED ORDER — PRENATAL VITAMIN 27-0.8 MG PO TABS: 1.0000 | ORAL_TABLET | Freq: Every day | ORAL | 12 refills | Status: AC

## 2023-08-21 NOTE — Progress Notes (Signed)
   LOW-RISK PREGNANCY OFFICE VISIT  Patient name: Kellie Padilla MRN [id number]  Date of birth: 03/11/1996 Chief Complaint:   No chief complaint on file.  Subjective:   Kellie Padilla is a 28 y.o. G1P0000 female at [redacted]w[redacted]d with an Estimated Date of Delivery: 02/11/24 being seen today for ongoing management of a low-risk pregnancy aeb has Mild intermittent asthma without complication; Constipation; Anxiety; Supervision of other normal pregnancy, antepartum; Routine general medical examination at a health care facility; Seasonal allergies; Obesity in pregnancy; Asthma during pregnancy; and Anxiety during pregnancy on their problem list.  Patient presents today, alone, with lower back pain. She contributes to changes with pregnancy. Patient denies abdominal cramping or contractions, but reports some cramping prior bowel movements.  Patient denies vaginal concerns including abnormal discharge, leaking of fluid, and bleeding. No issues with urination, constipation, or diarrhea.    Contractions: Not present. Vag. Bleeding: None.  Movement: Absent.  Reviewed past medical,surgical, social, obstetrical and family history as well as problem list, medications and allergies.  Objective   Vitals:   08/21/23 0836  BP: 103/69  Pulse: 85  Weight: 202 lb 3.2 oz (91.7 kg)  Body mass index is 38.21 kg/m.  Total Weight Gain:6 lb 3.2 oz (2.812 kg)         Physical Examination:   General appearance: Well appearing, and in no distress  Mental status: Alert, oriented to person, place, and time  Skin: Warm & dry  Cardiovascular: Normal heart rate noted  Respiratory: Normal respiratory effort, no distress  Abdomen: Not assessed  Pelvic: Cervical exam deferred           Extremities: Edema: None  Fetal Status: Fetal Heart Rate (bpm): 154  Movement: Absent   No results found for this or any previous visit (from the past 24 hours).  Assessment & Plan:  Low-risk pregnancy of a 28 y.o., G1P0000 at [redacted]w[redacted]d with an  Estimated Date of Delivery: 02/11/24   1. Supervision of other normal pregnancy, antepartum -Anticipatory guidance for upcoming appts. -Patient to schedule next appt in 4-5 weeks for an in-person visit. -Scheduled for anatomy scan on June 10.  Instructed to arrive at least 30 minutes early.   2. [redacted] weeks gestation of pregnancy -Doing well. -Discussed lower back pain as common complaint in pregnancy. Reviewed interventions to reduce discomfort.  -Encouraged to take childbirthing class.  Information placed in AVS.   3. Obesity in pregnancy -TWG 6lbs -Not taking bASA. -Reviewed PreEclampsia risk factors including AA Heritage, BMI >30, and Primigravida.  Reviewed research and findings supporting initiation of bASA to reduce risk of developing condition later in pregnancy.  Patient verbalizes understanding and agrees to plan. -Will pick up prescription today.       Meds:  Meds ordered this encounter  Medications   Prenatal Vit-Fe Fumarate-FA (PRENATAL VITAMIN) 27-0.8 MG TABS    Sig: Take 1 tablet by mouth daily.    Dispense:  30 tablet    Refill:  12   Labs/procedures today:  Lab Orders  No laboratory test(s) ordered today     Reviewed: Preterm labor symptoms and general obstetric precautions including but not limited to vaginal bleeding, contractions, leaking of fluid and fetal movement were reviewed in detail with the patient.  All questions were answered.  Follow-up: No follow-ups on file.  No orders of the defined types were placed in this encounter.  Kraig Peru MSN, CNM 08/21/2023

## 2023-08-21 NOTE — Progress Notes (Signed)
 Pt complains of lower back pain that started last week (scale of 7). Asking for prenatal vitamin prescription. Declined AFP testing today.  No other concerns at this time.

## 2023-08-21 NOTE — Patient Instructions (Signed)
 We highly recommend childbirth education to help you plan for labor and begin practicing coping skills (which will be needed with or without pain meds).  [place] Childbirth Education Options: Sign up by visiting [email or web address]  Childbirth ~ Self-Paced eClass (English and Spanish) This online class offers you the freedom to complete a childbirth education series in the comfort of your own home at your own pace.  Childbirth Class (In-Person 4-Week Series  or on Saturdays, Virtual 4-Week Series ~ [place]) This interactive in-person class series will help you and your partner prepare for your birth experience. Topics include: Labor & Birth, Comfort Measures, Breathing Techniques, Massage, Medical Interventions, Pain Management Options, Cesarean Birth, Postpartum Care, and Newborn Care  Comfort Techniques for Labor ~ In-Person Class [facility]) This interactive class is designed for parents-to-be who want to learn & practice hands-on skills to help relieve some of the discomfort of labor and encourage their babies to rotate toward the best position for birth. Moms and their partners will be able to try a variety of labor positions with birth balls and rebozos as well as practice breathing, relaxation, and visualization techniques.  Natural Childbirth Class (In-Person 5-Week Series, In-Person on Saturdays or Virtual 5-Week Series ~ [place]) This class series is designed for expectant parents who want to learn and practice natural methods of coping with the process of labor and childbirth.  Cesarean Birth Self-Paced eClass (English and Spanish) This online course provides comprehensive information you can trust as you prepare for a possible cesarean birth. In this class, you'll learn how to make your birth and recovery comfortable and joyful through instructive video clips, animations, and activities.  Waterbirth ~ Airline pilot Interested in a waterbirth? In addition to a consultation  with your credentialed waterbirth provider, this free, informational online class will help you discover whether waterbirth is the right fit for you. Not all obstetrical practices offer waterbirth, so check with your healthcare provider.  Tour Probation officer) - [facility] [employer] our 4 minute video tour of [facility] located in [place].   [place] Parenting Education Options:  Pregnancy 101 (Virtual) Congratulations on your pregnancy! This class is geared toward moms in their first trimester, but everyone is welcome. We are excited to guide you through all aspects of supporting a healthy pregnancy. You will learn what to expect at routine prenatal care appointments, common postpartum adjustments, basic infant safety, and breastfeeding.  Successful Partnering & Parenting ~ In-Person Workshop [facility]) This workshop inspires and equips partners of all economic levels, ages, and cultures to confidently care for their infants, support the birthing persons, and navigate their own transformations into new partners and parents. Learning activities are geared towards supporting partner, but moms are welcome to attend.  'Baby & Me' Parenting Group (Virtual on Wednesdays at 11am) Enjoy this time discussing newborn & infant parenting topics and family adjustment issues with other new parents in a relaxed environment. Each week brings a new speaker or baby-centered activity. This group offers support and connection to parents as they journey through the adjustments and struggles of that sometimes overwhelming first year after the birth of a child.  Baby Safety, CPR, & Choking Class ~ Virtual This life-saving information is meant to encourage parents as they learn important safety and prevention tips as well as infant CPR and relief of choking.  Breastfeeding Class (In-Person in [place] or [employer]) Families learn what to expect in the  first days and weeks of breastfeeding your  newborn. IF YOU ARE AN EMPLOYEE TAKING THIS CLASS FOR CREDIT, DO NOT register yourself. Please e-mail [email or web address].   Breastfeeding Self-Paced eClass (English & Spanish) Families learn what to expect in the first days and weeks of breastfeeding your newborn.  Caring for Baby ~ In-Person, Virtual or Self-Paced Class This in-person class is for both expectant and adoptive parents who want to learn and practice the most up-to-date newborn care for their babies. Focus is on birth through the first six weeks of life.  CPR & Choking Relief for Infants & Children ~ In-Person Class [facility]) This in-person course is designed for any parent, expectant parent, or adult who cares for infants or children. Participants learn and demonstrate cardiopulmonary resuscitation and choking relief procedures for both infants and children.  Grandparent Love ~ In-Person Class Grandparents will learn the most updated infant care and safety recommendations. They will discover ways to support their own children during the transition into the parenting role and receive tips on communicating with the new parents.  [place] Parenting Support Group Options:  Bereavement Grief Support Group (Pregnancy/Infant Loss) - Virtual This is an ongoing experience that meets once a month and is designed to help you honor the past, assist you in discovering tools to strengthen you today, and aid you in developing hope for the future.  Breastfeeding & Pumping Support Group (In-Person on Thursdays at 12pm or Virtual on Tuesdays at 5pm) Join [place] in-person each Thursday starting June 1st, 2023 at 12pm! This support group is free for all families looking for breastfeeding and/or pumping support.   Community-Based Childbirth Education Options:  [facility] Department Classes:  Childbirth education classes can help you get ready for a positive parenting experience. You  can also meet other expectant parents and get free stuff for your baby. Each class runs for five weeks on the same night and costs $45 for the mother-to-be and her support person. Medicaid covers the cost if you are eligible. Call [phone] to register.  [facility] [employer] [facility] offers a variety of programs for the [employer] and is another great way to get connected. Please go to [email or web address] for more information.  Childbirth With A Twist! Be informed of your options, get educated on birth, understand what your body is doing, learn how to cope, and have a lot of fun and laughs all while doing it either from the comfort of your couch OR in our cozy office and classroom space near the [place] airport. If you are taking a virtual class, then class is taught LIVE, so you can ask questions and receive answers in real-time from an experienced doula and childbirth educator.  This virtual childbirth education class will meet for five instruction times online.  Although we are based in [place], [place], this virtual class is open to anyone in the world. Please visit: [email or web address] for more information.  Books We Love: The Doula Guide to Childbirth by Harland German and Otila Back The First-Time Parent's Childbirth Handbook by Dr. Amie Critchley, CNM The Birth Partner by Truddie Crumble

## 2023-09-07 ENCOUNTER — Inpatient Hospital Stay (HOSPITAL_COMMUNITY)
Admission: AD | Admit: 2023-09-07 | Discharge: 2023-09-08 | Disposition: A | Discharge status: Home / Self Care | Attending: Obstetrics and Gynecology | Admitting: Obstetrics and Gynecology

## 2023-09-07 DIAGNOSIS — R102 Pelvic and perineal pain: Secondary | ICD-10-CM | POA: Insufficient documentation

## 2023-09-07 DIAGNOSIS — Z3A17 17 weeks gestation of pregnancy: Secondary | ICD-10-CM | POA: Insufficient documentation

## 2023-09-07 DIAGNOSIS — O26892 Other specified pregnancy related conditions, second trimester: Secondary | ICD-10-CM | POA: Insufficient documentation

## 2023-09-07 DIAGNOSIS — M549 Dorsalgia, unspecified: Secondary | ICD-10-CM | POA: Insufficient documentation

## 2023-09-07 DIAGNOSIS — N949 Unspecified condition associated with female genital organs and menstrual cycle: Secondary | ICD-10-CM

## 2023-09-08 ENCOUNTER — Encounter (HOSPITAL_COMMUNITY): Payer: Self-pay | Admitting: Student

## 2023-09-08 DIAGNOSIS — M549 Dorsalgia, unspecified: Secondary | ICD-10-CM | POA: Diagnosis not present

## 2023-09-08 DIAGNOSIS — R102 Pelvic and perineal pain: Secondary | ICD-10-CM | POA: Diagnosis not present

## 2023-09-08 DIAGNOSIS — Z3A17 17 weeks gestation of pregnancy: Secondary | ICD-10-CM | POA: Diagnosis not present

## 2023-09-08 DIAGNOSIS — O26892 Other specified pregnancy related conditions, second trimester: Secondary | ICD-10-CM | POA: Diagnosis present

## 2023-09-08 DIAGNOSIS — N949 Unspecified condition associated with female genital organs and menstrual cycle: Secondary | ICD-10-CM | POA: Diagnosis not present

## 2023-09-08 LAB — URINALYSIS, ROUTINE W REFLEX MICROSCOPIC
Bilirubin Urine: NEGATIVE
Glucose, UA: NEGATIVE mg/dL
Hgb urine dipstick: NEGATIVE
Ketones, ur: NEGATIVE mg/dL
Leukocytes,Ua: NEGATIVE
Nitrite: NEGATIVE
Protein, ur: NEGATIVE mg/dL
Specific Gravity, Urine: 1.025 (ref 1.005–1.030)
pH: 5 (ref 5.0–8.0)

## 2023-09-08 MED ORDER — CYCLOBENZAPRINE HCL 5 MG PO TABS
10.0000 mg | ORAL_TABLET | Freq: Once | ORAL | Status: AC
  Administered 2023-09-08: 10 mg via ORAL
  Filled 2023-09-08: qty 2

## 2023-09-08 MED ORDER — CYCLOBENZAPRINE HCL 10 MG PO TABS: 10.0000 mg | ORAL_TABLET | Freq: Two times a day (BID) | ORAL | 0 refills | Status: AC | PRN

## 2023-09-08 MED ORDER — ACETAMINOPHEN 500 MG PO TABS
1000.0000 mg | ORAL_TABLET | Freq: Once | ORAL | Status: AC
  Administered 2023-09-08: 1000 mg via ORAL
  Filled 2023-09-08: qty 2

## 2023-09-08 NOTE — MAU Provider Note (Signed)
 History     CSN: [id number]  Arrival date and time: 09/07/23 2359   Event Date/Time   First Provider Initiated Contact with Patient 09/08/23 0141      Chief Complaint  Patient presents with   Abdominal Pain   HPI [name] is a 28 y.o. year old G52P0000 female at [redacted]w[redacted]d weeks gestation who presents to MAU reporting pain on LT side that sharp. She describes it as pressure on the LT side. She reports the pain increased while standing. She report back pain; rated 7-8/10. She receives [facility] with [facility]; next appt is 09/09/2023. Her partner is present and contributing to the history taking.    OB History     Gravida  1   Para  0   Term  0   Preterm  0   AB  0   Living  0      SAB  0   IAB  0   Ectopic  0   Multiple  0   Live Births  0           Past Medical History:  Diagnosis Date   Allergy     Anxiety    Asthma    Irregular menstrual cycle     Past Surgical History:  Procedure Laterality Date   NO PAST SURGERIES      Family History  Problem Relation Age of Onset   Healthy Mother    Healthy Father    Thyroid  disease Sister    Cancer Maternal Grandmother        breast   Cancer Maternal Grandfather        lung    Social History   Tobacco Use   Smoking status: Never    Passive exposure: Yes   Smokeless tobacco: Never  Vaping Use   Vaping status: Never Used  Substance Use Topics   Alcohol use: Not Currently    Comment: socially   Drug use: No    Allergies:  Allergies  Allergen Reactions   Dust Mite Extract Anaphylaxis   Pollen Extract Anaphylaxis   Shellfish Allergy      Medications Prior to Admission  Medication Sig Dispense Refill Last Dose/Taking   cetirizine  (ZYRTEC  ALLERGY ) 10 MG tablet Take 1 tablet (10 mg total) by mouth daily. 30 tablet 0 Past Month   Prenatal Vit-Fe Fumarate-FA (PRENATAL VITAMIN) 27-0.8 MG TABS Take 1 tablet by mouth daily. 30 tablet 12 09/08/2023 Morning   albuterol  (VENTOLIN  HFA) 108 (90 Base) MCG/ACT  inhaler Inhale 2 puffs into the lungs every 6 (six) hours as needed. (Patient not taking: Reported on 07/10/2023) 18 g 0    albuterol  (VENTOLIN  HFA) 108 (90 Base) MCG/ACT inhaler Inhale 1-2 puffs into the lungs every 6 (six) hours as needed for wheezing or shortness of breath. 8 g 1    aspirin  EC 81 MG tablet Take 1 tablet (81 mg total) by mouth at bedtime. Start taking when you are [redacted] weeks pregnant for rest of pregnancy for prevention of preeclampsia 300 tablet 2    Blood Pressure Monitoring (BLOOD PRESSURE KIT) DEVI 1 Device by Does not apply route once a week. 1 each 0    Prenatal Vit-Fe Fumarate-FA (PRENATAL MULTIVITAMIN) TABS tablet Take 1 tablet by mouth daily at 12 noon. (Patient not taking: Reported on 08/21/2023)       Review of Systems  Constitutional: Negative.   HENT: Negative.    Eyes: Negative.   Respiratory: Negative.    Cardiovascular: Negative.  Gastrointestinal: Negative.   Endocrine: Negative.   Genitourinary:  Positive for pelvic pain (LT side, sharp that started about 1 hout before coming to MAU).  Musculoskeletal: Negative.   Skin: Negative.   Allergic/Immunologic: Negative.   Neurological: Negative.   Hematological: Negative.   Psychiatric/Behavioral: Negative.     Physical Exam   Blood pressure 115/68, pulse 84, temperature (!) 97.4 F (36.3 C), resp. rate 18, height 5\' 1"  (1.549 m), weight 92.5 kg, last menstrual period 04/21/2023.  Physical Exam Vitals and nursing note reviewed.  Constitutional:      Appearance: Normal appearance. She is obese.  Pulmonary:     Effort: Pulmonary effort is normal.  Abdominal:     Palpations: Abdomen is soft.     Tenderness: There is abdominal tenderness (lower LT pelvis).  Genitourinary:    Comments: deferred Musculoskeletal:        General: Normal range of motion.  Skin:    General: Skin is warm and dry.  Neurological:     Mental Status: She is alert and oriented to person, place, and time.  Psychiatric:         Mood and Affect: Mood normal.        Behavior: Behavior normal.        Thought Content: Thought content normal.        Judgment: Judgment normal.   FHTs by doppler: 147 bpm  MAU Course  Procedures  MDM CCUA Flexeril 10 mg po -- resolved pain Tylenol 1000 mg po   Results for orders placed or performed during the hospital encounter of 09/07/23 (from the past 24 hours)  Urinalysis, Routine w reflex microscopic -Urine, Clean Catch     Status: Abnormal   Collection Time: 09/08/23 12:10 AM  Result Value Ref Range   Color, Urine YELLOW YELLOW   APPearance HAZY (A) CLEAR   Specific Gravity, Urine 1.025 1.005 - 1.030   pH 5.0 5.0 - 8.0   Glucose, UA NEGATIVE NEGATIVE mg/dL   Hgb urine dipstick NEGATIVE NEGATIVE   Bilirubin Urine NEGATIVE NEGATIVE   Ketones, ur NEGATIVE NEGATIVE mg/dL   Protein, ur NEGATIVE NEGATIVE mg/dL   Nitrite NEGATIVE NEGATIVE   Leukocytes,Ua NEGATIVE NEGATIVE    Assessment and Plan  1. Round ligament pain (Primary) - Prescription for: Flexeril 10 mg po  - Discussed exercises/massages and comfort measures for RLP  2. [redacted] weeks gestation of pregnancy   - Discharge home  - Keep scheduled appt with [facility] on 09/09/2023 - Patient verbalized an understanding of the plan of care and agrees.   Almond Army, CNM 09/08/2023, 1:41 AM

## 2023-09-08 NOTE — MAU Note (Signed)
 Kellie Padilla is a 28 y.o. at [redacted]w[redacted]d here in MAU reporting: having pain ion left side that is sharp and having pressure on that side. Pan Is worse when she stands a lot. C/o back pain as well   LMP:  Onset of complaint: today Pain score: 7-8 Vitals:   09/08/23 0015  BP: 115/68  Pulse: 84  Resp: 18  Temp: (!) 97.4 F (36.3 C)     FHT: 147   Lab orders placed from triage: u/a

## 2023-09-09 ENCOUNTER — Ambulatory Visit (INDEPENDENT_AMBULATORY_CARE_PROVIDER_SITE_OTHER): Admitting: Licensed Clinical Social Worker

## 2023-09-09 DIAGNOSIS — F4329 Adjustment disorder with other symptoms: Secondary | ICD-10-CM

## 2023-09-09 NOTE — BH Specialist Note (Unsigned)
 Integrated Behavioral Health via Telemedicine Visit  Dylanie Quesenberry [id number]  Number of Integrated Behavioral Health Clinician visits: 1- Initial Visit  Session Start time: 0845   Session End time: 0944  Total time in minutes: 59    Referring Provider: [place] Patient/Family location: At [facility] Provider location: Remote Office All persons participating in visit: Patient and [facility] Types of Service: Individual psychotherapy and Video visit  I connected with Charissa Compton and/or Carolynne Citron Certain's patient via  Telephone or Video Enabled Telemedicine Application  (Video is Caregility application) and verified that I am speaking with the correct person using two identifiers. Discussed confidentiality: Yes   I discussed the limitations of telemedicine and the availability of in person appointments.  Discussed there is a possibility of technology failure and discussed alternative modes of communication if that failure occurs.  I discussed that engaging in this telemedicine visit, they consent to the provision of behavioral healthcare and the services will be billed under their insurance.  Patient and/or legal guardian expressed understanding and consented to Telemedicine visit: Yes   Presenting Concerns: Patient and/or family reports the following symptoms/concerns: increased stressor Duration of problem: Month; Severity of problem: moderate  Patient and/or Family's Strengths/Protective Factors: Concrete supports in place (healthy food, safe environments, etc.) and Sense of purpose  Goals Addressed: Patient will:  Reduce symptoms of: stress   Increase knowledge and/or ability of: coping skills, healthy habits, and stress reduction   Demonstrate ability to: Increase healthy adjustment to current life circumstances  Progress towards Goals: Ongoing    Interventions: Interventions utilized:  Supportive Counseling, Psychoeducation and/or Health Education, and Supportive  Reflection Standardized Assessments completed: Not Needed    Patient and/or Family Response: Patient was present for today's virtual session. She reports currently being enrolled in an online program studying Criminal Justice and [employer]. She is also employed full time at the [employer], which she describes as a significant source of stress. 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 Patient shared that she is originally from [place] , where most of her family resides. She is currently pregnant with a female child and described the pregnancy as progressing smoothly. Patient discussed her relationship with the child's father as toxic and reported making the executive decision to end the relationship. She acknowledged that both her job and her partner have contributed to increased stress, and she is actively working to minimize these stressors. Patient stated she is being mindful of self-care and continues to utilize coping strategies, including talking to supportive family and friends, praying with her mother, journaling, and driving as a means of emotional regulation. Clinical Assessment/Diagnosis  Adjustment disorder with other symptom    Assessment: Patient currently experiencing elevated stress related to her full-time job and a recently ended toxic relationship while navigating pregnancy and managing multiple responsibilities, including work, school, and driving for [employer]. She is actively working to reduce stress and engage in self-care through various coping strategies..   Patient may benefit from continued integrated behavioral health services. .  Plan: Follow up with behavioral health clinician on : 09/18/23 Behavioral recommendations: Patient to continue utilizing healthy coping strategies such as journaling, connecting with supportive family and friends, and engaging in spiritual practices. Additionally, continue to set healthy boundaries to  manage stress and prioritize self-care throughout pregnancy. Referral(s): Integrated [employer] (In Clinic)  I discussed the assessment and treatment plan with the patient and/or parent/guardian. They were provided an opportunity to ask questions and all  were answered. They agreed with the plan and demonstrated an understanding of the instructions.   They were advised to call back or seek an in-person evaluation if the symptoms worsen or if the condition fails to improve as anticipated.  Pilar Corrales Alfonza Iles, LCSWA

## 2023-09-17 ENCOUNTER — Ambulatory Visit

## 2023-09-17 ENCOUNTER — Ambulatory Visit: Attending: Obstetrics & Gynecology

## 2023-09-17 ENCOUNTER — Other Ambulatory Visit: Payer: Self-pay | Admitting: *Deleted

## 2023-09-17 ENCOUNTER — Ambulatory Visit: Admitting: Obstetrics and Gynecology

## 2023-09-17 VITALS — BP 111/62 | HR 88

## 2023-09-17 DIAGNOSIS — O99512 Diseases of the respiratory system complicating pregnancy, second trimester: Secondary | ICD-10-CM | POA: Insufficient documentation

## 2023-09-17 DIAGNOSIS — Z3689 Encounter for other specified antenatal screening: Secondary | ICD-10-CM | POA: Insufficient documentation

## 2023-09-17 DIAGNOSIS — O321XX0 Maternal care for breech presentation, not applicable or unspecified: Secondary | ICD-10-CM | POA: Diagnosis not present

## 2023-09-17 DIAGNOSIS — Z3480 Encounter for supervision of other normal pregnancy, unspecified trimester: Secondary | ICD-10-CM | POA: Diagnosis present

## 2023-09-17 DIAGNOSIS — Z36 Encounter for antenatal screening for chromosomal anomalies: Secondary | ICD-10-CM | POA: Diagnosis not present

## 2023-09-17 DIAGNOSIS — J45909 Unspecified asthma, uncomplicated: Secondary | ICD-10-CM

## 2023-09-17 DIAGNOSIS — Z3A19 19 weeks gestation of pregnancy: Secondary | ICD-10-CM | POA: Diagnosis not present

## 2023-09-17 DIAGNOSIS — O99340 Other mental disorders complicating pregnancy, unspecified trimester: Secondary | ICD-10-CM | POA: Insufficient documentation

## 2023-09-17 DIAGNOSIS — F419 Anxiety disorder, unspecified: Secondary | ICD-10-CM | POA: Diagnosis present

## 2023-09-17 DIAGNOSIS — O99212 Obesity complicating pregnancy, second trimester: Secondary | ICD-10-CM

## 2023-09-17 DIAGNOSIS — O99342 Other mental disorders complicating pregnancy, second trimester: Secondary | ICD-10-CM | POA: Insufficient documentation

## 2023-09-17 DIAGNOSIS — Z362 Encounter for other antenatal screening follow-up: Secondary | ICD-10-CM

## 2023-09-17 DIAGNOSIS — E669 Obesity, unspecified: Secondary | ICD-10-CM | POA: Diagnosis not present

## 2023-09-17 NOTE — Progress Notes (Signed)
 Maternal-Fetal Medicine Consultation Name: Kellie Padilla MRN: [id number]  G1 P0 at 19-weeks' gestation. Patient is here for fetal anatomy scan. On cell-free fetal DNA screening, the risks of aneuploidies are not increased.   We performed fetal anatomy scan. No makers of aneuploidies or fetal structural defects are seen. Fetal biometry is consistent with her previously-established dates. Amniotic fluid is normal and good fetal activity is seen. Patient understands the limitations of ultrasound in detecting fetal anomalies.   As maternal obesity imposes limitations on the resolution of images, fetal anomalies may be missed.  Patient has mild intermittent asthma.  She does not have nocturnal episodes and does not give history of intubation.  She takes albuterol  as needed.  I counseled on the importance of taking medications to control asthma.  Severe maternal asthma can lead to maternal-fetal complications.  Albuterol  is not associated with increased risk of fetal congenital malformations.  Recommendations -An appointment was made for her to return in 5 weeks for completion of fetal anatomy.  Consultation including face-to-face (more than 50%) counseling 15 minutes.

## 2023-09-18 ENCOUNTER — Ambulatory Visit: Payer: Self-pay | Admitting: Licensed Clinical Social Worker

## 2023-09-18 DIAGNOSIS — F4329 Adjustment disorder with other symptoms: Secondary | ICD-10-CM

## 2023-09-18 NOTE — BH Specialist Note (Unsigned)
 Integrated Behavioral Health via Telemedicine Visit  09/22/2023 Niyati Heinke [id number]  Number of Integrated Behavioral Health Clinician visits: 2- Second Visit  Session Start time: 0845   Session End time: 0946  Total time in minutes: 61    Referring Provider: [place] Patient/Family location: At [facility] Provider location: Remote Office All persons participating in visit: Patient and [facility] Types of Service: Individual psychotherapy and Video visit  I connected with Charissa Compton and/or Carolynne Citron Cocco's patient via  Telephone or Video Enabled Telemedicine Application  (Video is Caregility application) and verified that I am speaking with the correct person using two identifiers. Discussed confidentiality: Yes   I discussed the limitations of telemedicine and the availability of in person appointments.  Discussed there is a possibility of technology failure and discussed alternative modes of communication if that failure occurs.  I discussed that engaging in this telemedicine visit, they consent to the provision of behavioral healthcare and the services will be billed under their insurance.  Patient and/or legal guardian expressed understanding and consented to Telemedicine visit: Yes   Presenting Concerns: Patient and/or family reports the following symptoms/concerns: Improvements with mood and relationship with father.  Duration of problem: Months; Severity of problem: moderate  Patient and/or Family's Strengths/Protective Factors: Social and Emotional competence, Concrete supports in place (healthy food, safe environments, etc.), and Sense of purpose  Goals Addressed: Patient will:  Reduce symptoms of: stress   Increase knowledge and/or ability of: coping skills, healthy habits, and stress reduction   Demonstrate ability to: Increase healthy adjustment to current life circumstances  Progress towards Goals: Ongoing  Interventions: Interventions utilized:  Solution-Focused  Strategies, Supportive Counseling, Psychoeducation and/or Health Education, and Supportive Reflection Standardized Assessments completed: Not Needed   Patient and/or Family Response: ultrasound completed- baby stubborn rescheduled for follow up utlrasound.   She and dad are on good terms.. went out to eat and are working on communicating in a better way.. Working hard to understand each other.   Work is really stressful. Did not go today, took some time off to look for another job. Very sarcastic. Looking for a work from home job.   Have continued to write in her journal.. have continued to pray, meeting up with friends, and setting healthy boundaries.   Pregnancy case worker for [facility] referral. Emmaline Haring set.Santo Cullen referral.  Backpack beginnings. Gloves, hats,   Clinical Assessment/Diagnosis  Adjustment disorder with other symptom    Assessment: Patient currently experiencing ***.   Patient may benefit from continued support from integrated behavioral health services..  Plan: Follow up with behavioral health clinician on : 09/25/2023 Behavioral recommendations: *** Referral(s): Integrated [employer] (In Clinic)  I discussed the assessment and treatment plan with the patient and/or parent/guardian. They were provided an opportunity to ask questions and all were answered. They agreed with the plan and demonstrated an understanding of the instructions.   They were advised to call back or seek an in-person evaluation if the symptoms worsen or if the condition fails to improve as anticipated.  Chantelle Verdi Alfonza Iles, LCSWA

## 2023-09-25 ENCOUNTER — Ambulatory Visit: Payer: Self-pay | Admitting: Licensed Clinical Social Worker

## 2023-09-25 DIAGNOSIS — F4329 Adjustment disorder with other symptoms: Secondary | ICD-10-CM

## 2023-09-30 NOTE — BH Specialist Note (Signed)
 Integrated Behavioral Health via Telemedicine Visit  09/30/2023 Julina Altmann [id number]  Number of Integrated Behavioral Health Clinician visits: 3- Third Visit  Session Start time: 0845   Session End time: 0905  Total time in minutes: 20    Referring Provider: Abigail Patient/Family location: At [facility] Provider location: Remote Office All persons participating in visit: Patient and [facility] Types of Service: Individual psychotherapy and Video visit  I connected with [name] and/or Waddell Peppard's patient via  Telephone or Video Enabled Telemedicine Application  (Video is Caregility application) and verified that I am speaking with the correct person using two identifiers. Discussed confidentiality: Yes   I discussed the limitations of telemedicine and the availability of in person appointments.  Discussed there is a possibility of technology failure and discussed alternative modes of communication if that failure occurs.  I discussed that engaging in this telemedicine visit, they consent to the provision of behavioral healthcare and the services will be billed under their insurance.  Patient and/or legal guardian expressed understanding and consented to Telemedicine visit: Yes   Presenting Concerns: Patient and/or family reports the following symptoms/concerns: Ongoing conflict with child's father. Duration of problem: Months; Severity of problem: moderate  Patient and/or Family's Strengths/Protective Factors: Social connections, Concrete supports in place (healthy food, safe environments, etc.), and Caregiver has knowledge of parenting & child development  Goals Addressed: Patient will:  Reduce symptoms of: mood instability and stress   Increase knowledge and/or ability of: coping skills, healthy habits, and stress reduction   Demonstrate ability to: Increase healthy adjustment to current life circumstances and Increase adequate support systems for patient/family  Progress  towards Goals: Ongoing    Interventions: Interventions utilized:  Solution-Focused Strategies, Supportive Counseling, Psychoeducation and/or Health Education, and Supportive Reflection Standardized Assessments completed: Not Needed    Patient and/or Family Response: Patient was present for today's virtual session and engaged in processing ongoing conflict with her child's father. She reported recent efforts to co-parent and improve communication; however, these attempts were unsuccessful and culminated in an argument following an overnight visit, after which communication ceased. Patient reflected on her role in the situation, acknowledging a pattern of being overly accommodating and expressed a desire to establish and maintain clearer boundaries to prevent relational confusion. She verbalized understanding that future interactions with her child's father should remain focused solely on co-parenting responsibilities. Patient demonstrated insight into the negative impact of elevated stress levels on her pregnancy and explored strategies to support emotional regulation and maintain control over her thoughts and mood.  Clinical Assessment/Diagnosis  No diagnosis found.    Assessment: Patient currently experiencing  emotional distress related to ongoing conflict and poor communication with her child's father, which has impacted her ability to co-parent effectively. She is also navigating increased stress during her pregnancy and working to establish healthier boundaries..   Patient may benefit from continued support of integrated behavioral health services.  Plan: Follow up with behavioral health clinician on : 10/02/23 Behavioral recommendations: The patient is encouraged to set and maintain clear boundaries with her child's father, keeping communication focused solely on co-parenting. She is also working on implementing strategies to manage stress and regulate her emotions to support her mental  health during pregnancy. [facility] completed referral for pregnancy navigator with [facility] Referral(s): Integrated [employer] (In Clinic)  I discussed the assessment and treatment plan with the patient and/or parent/guardian. They were provided an opportunity to ask questions and all were answered. They agreed with the plan and demonstrated  an understanding of the instructions.   They were advised to call back or seek an in-person evaluation if the symptoms worsen or if the condition fails to improve as anticipated.  Everette Mall LITTIE Seats, LCSWA

## 2023-10-02 ENCOUNTER — Ambulatory Visit (INDEPENDENT_AMBULATORY_CARE_PROVIDER_SITE_OTHER): Admitting: Physician Assistant

## 2023-10-02 ENCOUNTER — Ambulatory Visit: Admitting: Licensed Clinical Social Worker

## 2023-10-02 VITALS — BP 122/78 | HR 95 | Wt 208.7 lb

## 2023-10-02 DIAGNOSIS — Z3A21 21 weeks gestation of pregnancy: Secondary | ICD-10-CM | POA: Diagnosis not present

## 2023-10-02 DIAGNOSIS — O99340 Other mental disorders complicating pregnancy, unspecified trimester: Secondary | ICD-10-CM | POA: Diagnosis not present

## 2023-10-02 DIAGNOSIS — O99210 Obesity complicating pregnancy, unspecified trimester: Secondary | ICD-10-CM

## 2023-10-02 DIAGNOSIS — J4520 Mild intermittent asthma, uncomplicated: Secondary | ICD-10-CM | POA: Diagnosis not present

## 2023-10-02 DIAGNOSIS — F419 Anxiety disorder, unspecified: Secondary | ICD-10-CM | POA: Diagnosis not present

## 2023-10-02 DIAGNOSIS — Z3480 Encounter for supervision of other normal pregnancy, unspecified trimester: Secondary | ICD-10-CM

## 2023-10-02 NOTE — Progress Notes (Signed)
   PRENATAL VISIT NOTE  Subjective:  Kellie Padilla is a 28 y.o. G1P0000 at [redacted]w[redacted]d being seen today for ongoing prenatal care.  She is currently monitored for the following issues for this high-risk pregnancy and has Mild intermittent asthma without complication; Supervision of other normal pregnancy, antepartum; Routine general medical examination at a health care facility; Seasonal allergies; Obesity in pregnancy; Asthma during pregnancy; and Anxiety during pregnancy on their problem list.  Patient reports no complaints.  Contractions: Not present. Vag. Bleeding: None.  Movement: Present. Denies leaking of fluid.   The following portions of the patient's history were reviewed and updated as appropriate: allergies, current medications, past family history, past medical history, past social history, past surgical history and problem list.   Objective:    Vitals:   10/02/23 1626  BP: 122/78  Pulse: 95  Weight: 208 lb 11.2 oz (94.7 kg)    Fetal Status:  Fetal Heart Rate (bpm): 154 Fundal Height: 23 cm Movement: Present    General: Alert, oriented and cooperative. Patient is in no acute distress.  Skin: Skin is warm and dry. No rash noted.   Cardiovascular: Normal heart rate noted  Respiratory: Normal respiratory effort, no problems with respiration noted  Abdomen: Soft, gravid, appropriate for gestational age.  Pain/Pressure: Present     Pelvic: Cervical exam deferred        Extremities: Normal range of motion.  Edema: None  Mental Status: Normal mood and affect. Normal behavior. Normal judgment and thought content.   Assessment and Plan:  Pregnancy: G1P0000 at [redacted]w[redacted]d  1. Supervision of other normal pregnancy, antepartum (Primary) Patient doing well, feeling regular fetal movement  BP, FHR, FH appropriate  2. [redacted] weeks gestation of pregnancy Anticipatory guidance about next visits/weeks of pregnancy given.   3. Mild intermittent asthma without complication Reports no daytime sxs or  nighttime awakenings   Preterm labor symptoms and general obstetric precautions including but not limited to vaginal bleeding, contractions, leaking of fluid and fetal movement were reviewed in detail with the patient.  Please refer to After Visit Summary for other counseling recommendations.   Return in about 4 weeks (around 10/30/2023) for LOB.  Future Appointments  Date Time Provider Department Center  10/09/2023  8:45 AM Sherrine Marcelyn LITTIE ISRAEL [facility] None  10/23/2023  8:00 AM [facility] PROVIDER 1 [facility]  10/23/2023  8:30 AM [facility] US2 [facility]    Jorene FORBES Moats, PA-C

## 2023-10-02 NOTE — BH Specialist Note (Unsigned)
 Integrated Behavioral Health via Telemedicine Visit  10/04/2023 Kellie Padilla [id number]  Number of Integrated Behavioral Health Clinician visits: 4- Fourth Visit  Session Start time: 0845   Session End time: 0916  Total time in minutes: 31    Referring Provider: Abigail Patient/Family location: At [facility] Provider location: Remote Office All persons participating in visit: Patient and Kellie Padilla Types of Service: Individual psychotherapy and Video visit  I connected with Kellie Padilla and/or Kellie Padilla's patient via  Telephone or Video Enabled Telemedicine Application  (Video is Caregility application) and verified that I am speaking with the correct person using two identifiers. Discussed confidentiality: Yes   I discussed the limitations of telemedicine and the availability of in person appointments.  Discussed there is a possibility of technology failure and discussed alternative modes of communication if that failure occurs.  I discussed that engaging in this telemedicine visit, they consent to the provision of behavioral healthcare and the services will be billed under their insurance.  Patient and/or legal guardian expressed understanding and consented to Telemedicine visit: Yes   Presenting Concerns: Patient and/or family reports the following symptoms/concerns: increased conflict with child's father.  Duration of problem: Months; Severity of problem: moderate  Patient and/or Family's Strengths/Protective Factors: Social connections, Social and Emotional competence, Concrete supports in place (healthy food, safe environments, etc.), Physical Health (exercise, healthy diet, medication compliance, etc.), and Caregiver has knowledge of parenting & child development  Goals Addressed: Patient will:  Reduce symptoms of: anxiety, mood instability, and stress   Increase knowledge and/or ability of: coping skills, healthy habits, self-management skills, and stress reduction    Demonstrate ability to: Increase healthy adjustment to current life circumstances and Increase adequate support systems for patient/family  Progress towards Goals: Ongoing    Interventions: Interventions utilized:  Mindfulness or Relaxation Training, Supportive Counseling, Psychoeducation and/or Health Education, and Supportive Reflection Standardized Assessments completed: Not Needed    Patient and/or Family Response: Patient was present on today's virtual session. Patient reports her child's father is blocked.. she attempted to maintain stricter boundaries with him but he continued to disrecpect the boundaries. She reported her child's father pulled up to her job without permission while her female friend was present and she had a really negative interaction wit him. Patient reports her child's father continued to disrespect by name calling and sharing her business. Patient reports she is okay with never speaking to patient's father again.   Calling her a hoe. Sharing with multiple women stating that he will raise her son with another women. She's a bomb, dirty carpet and no couch.   She's okay with father not being involved in her life and her child's life. She blocked.   Second interview with a roofing company.  She has been ubering more..     Clinical Assessment/Diagnosis  No diagnosis found.    Assessment: Patient currently experiencing ***.   Patient may benefit from ***.  Plan: Follow up with behavioral health clinician on : 10/09/23 Behavioral recommendations: *** Referral(s): Integrated [employer] (In Clinic)  I discussed the assessment and treatment plan with the patient and/or parent/guardian. They were provided an opportunity to ask questions and all were answered. They agreed with the plan and demonstrated an understanding of the instructions.   They were advised to call back or seek an in-person evaluation if the symptoms worsen or if the condition  fails to improve as anticipated.  Kellie Padilla, LCSWA

## 2023-10-02 NOTE — Progress Notes (Signed)
 Pt is in the office for ROB, reports fetal movement, denies pain today.

## 2023-10-04 LAB — AFP, SERUM, OPEN SPINA BIFIDA
AFP MoM: 0.79
AFP Value: 46.2 ng/mL
Gest. Age on Collection Date: 21 wk
Maternal Age At EDD: 26.2 a
OSBR Risk 1 IN: 10000
Test Results:: NEGATIVE
Weight: 208 [lb_av]

## 2023-10-05 ENCOUNTER — Ambulatory Visit: Payer: Self-pay | Admitting: Physician Assistant

## 2023-10-09 ENCOUNTER — Ambulatory Visit: Admitting: Licensed Clinical Social Worker

## 2023-10-09 DIAGNOSIS — Z91199 Patient's noncompliance with other medical treatment and regimen due to unspecified reason: Secondary | ICD-10-CM

## 2023-10-09 NOTE — BH Specialist Note (Signed)
 Patient was present during today's session. She apologized  reporting that she forgot about today's session and would have to reschedule.

## 2023-10-15 ENCOUNTER — Ambulatory Visit: Admitting: Licensed Clinical Social Worker

## 2023-10-15 DIAGNOSIS — Z3A00 Weeks of gestation of pregnancy not specified: Secondary | ICD-10-CM | POA: Diagnosis not present

## 2023-10-15 DIAGNOSIS — F419 Anxiety disorder, unspecified: Secondary | ICD-10-CM

## 2023-10-15 DIAGNOSIS — Z630 Problems in relationship with spouse or partner: Secondary | ICD-10-CM

## 2023-10-15 DIAGNOSIS — O99340 Other mental disorders complicating pregnancy, unspecified trimester: Secondary | ICD-10-CM

## 2023-10-15 NOTE — BH Specialist Note (Signed)
 Integrated Behavioral Health via Telemedicine Visit  10/21/2023 Jillana Selph [id number]  Number of Integrated Behavioral Health Clinician visits: 5-Fifth Visit  Session Start time: 0845   Session End time: 0924  Total time in minutes: 39    Referring Provider: Abigail Patient/Family location: At [facility] Provider location: Remote Office All persons participating in visit: Patient and [facility] Types of Service: Individual psychotherapy and Video visit  I connected with [name] and/or Waddell Sawin's patient via  Telephone or Video Enabled Telemedicine Application  (Video is Caregility application) and verified that I am speaking with the correct person using two identifiers. Discussed confidentiality: Yes   I discussed the limitations of telemedicine and the availability of in person appointments.  Discussed there is a possibility of technology failure and discussed alternative modes of communication if that failure occurs.  I discussed that engaging in this telemedicine visit, they consent to the provision of behavioral healthcare and the services will be billed under their insurance.  Patient and/or legal guardian expressed understanding and consented to Telemedicine visit: Yes   Presenting Concerns: Patient and/or family reports the following symptoms/concerns: improvements with mood.  Duration of problem: Months; Severity of problem: moderate  Patient and/or Family's Strengths/Protective Factors: Social and Emotional competence, Concrete supports in place (healthy food, safe environments, etc.), and Physical Health (exercise, healthy diet, medication compliance, etc.)  Goals Addressed: Patient will:  Reduce symptoms of: anxiety, mood instability, and stress   Increase knowledge and/or ability of: coping skills, healthy habits, self-management skills, and stress reduction   Demonstrate ability to: Increase healthy adjustment to current life circumstances and Increase adequate  support systems for patient/family  Progress towards Goals: Ongoing    Interventions: Interventions utilized:  Supportive Counseling, Communication Skills, and Supportive Reflection Standardized Assessments completed: Not Needed    Patient and/or Family Response: The patient was present for today's virtual session and reported returning from a family trip, which included time spent with her boyfriend. She shared that she has successfully caught up on her schoolwork and is currently in a positive headspace and mood. The patient expressed a sense of empowerment after making the decision to discontinue all communication with her child's father due to ongoing disrespect and ineffective communication. She also shared excitement about her family planning her upcoming baby shower in her hometown of [place] . Additionally, the patient connected with a pregnancy navigator for resources, completed a doula referral, and registered for a breastfeeding class through [facility].   Clinical Assessment/Diagnosis  Anxiety during pregnancy  Partner relational problem    Assessment: Patient currently experiencing  experiencing improved mood and mental well-being, feeling emotionally empowered after setting boundaries with her child's father and successfully managing academic and personal responsibilities..   Patient may benefit from continued support in integrated behavioral health.  Plan: /Follow up with behavioral health clinician on : 10/29/2023 Behavioral recommendations:  patient to continue engaging in supportive activities and resources, such as attending the breastfeeding class and utilizing her doula services, while maintaining healthy boundaries and building on her current positive coping strategies Referral(s): Integrated [employer] (In Clinic)  I discussed the assessment and treatment plan with the patient and/or parent/guardian. They were provided an opportunity to ask questions  and all were answered. They agreed with the plan and demonstrated an understanding of the instructions.   They were advised to call back or seek an in-person evaluation if the symptoms worsen or if the condition fails to improve as anticipated.  Diamante Truszkowski LITTIE Seats, LCSWA

## 2023-10-23 ENCOUNTER — Ambulatory Visit (HOSPITAL_BASED_OUTPATIENT_CLINIC_OR_DEPARTMENT_OTHER): Admitting: Obstetrics

## 2023-10-23 ENCOUNTER — Ambulatory Visit

## 2023-10-23 ENCOUNTER — Inpatient Hospital Stay (HOSPITAL_COMMUNITY)
Admission: AD | Admit: 2023-10-23 | Discharge: 2023-10-23 | Disposition: A | Discharge status: Home / Self Care | Attending: Obstetrics and Gynecology | Admitting: Obstetrics and Gynecology

## 2023-10-23 ENCOUNTER — Other Ambulatory Visit: Payer: Self-pay | Admitting: *Deleted

## 2023-10-23 ENCOUNTER — Encounter (HOSPITAL_COMMUNITY): Payer: Self-pay | Admitting: Obstetrics and Gynecology

## 2023-10-23 VITALS — BP 118/65 | HR 82

## 2023-10-23 DIAGNOSIS — J45909 Unspecified asthma, uncomplicated: Secondary | ICD-10-CM

## 2023-10-23 DIAGNOSIS — O26892 Other specified pregnancy related conditions, second trimester: Secondary | ICD-10-CM

## 2023-10-23 DIAGNOSIS — Z3A24 24 weeks gestation of pregnancy: Secondary | ICD-10-CM

## 2023-10-23 DIAGNOSIS — O99612 Diseases of the digestive system complicating pregnancy, second trimester: Secondary | ICD-10-CM | POA: Diagnosis not present

## 2023-10-23 DIAGNOSIS — O99212 Obesity complicating pregnancy, second trimester: Secondary | ICD-10-CM | POA: Insufficient documentation

## 2023-10-23 DIAGNOSIS — J4521 Mild intermittent asthma with (acute) exacerbation: Secondary | ICD-10-CM

## 2023-10-23 DIAGNOSIS — O99210 Obesity complicating pregnancy, unspecified trimester: Secondary | ICD-10-CM

## 2023-10-23 DIAGNOSIS — Z362 Encounter for other antenatal screening follow-up: Secondary | ICD-10-CM | POA: Insufficient documentation

## 2023-10-23 DIAGNOSIS — E669 Obesity, unspecified: Secondary | ICD-10-CM

## 2023-10-23 DIAGNOSIS — J069 Acute upper respiratory infection, unspecified: Secondary | ICD-10-CM | POA: Diagnosis not present

## 2023-10-23 DIAGNOSIS — O99512 Diseases of the respiratory system complicating pregnancy, second trimester: Secondary | ICD-10-CM | POA: Insufficient documentation

## 2023-10-23 DIAGNOSIS — R062 Wheezing: Secondary | ICD-10-CM | POA: Diagnosis present

## 2023-10-23 DIAGNOSIS — R059 Cough, unspecified: Secondary | ICD-10-CM | POA: Diagnosis present

## 2023-10-23 LAB — RESP PANEL BY RT-PCR (RSV, FLU A&B, COVID)  RVPGX2
Influenza A by PCR: NEGATIVE
Influenza B by PCR: NEGATIVE
Resp Syncytial Virus by PCR: NEGATIVE
SARS Coronavirus 2 by RT PCR: NEGATIVE

## 2023-10-23 MED ORDER — IPRATROPIUM-ALBUTEROL 0.5-2.5 (3) MG/3ML IN SOLN
3.0000 mL | Freq: Once | RESPIRATORY_TRACT | Status: AC
  Administered 2023-10-23: 3 mL via RESPIRATORY_TRACT
  Filled 2023-10-23: qty 3

## 2023-10-23 MED ORDER — HYDROCOD POLI-CHLORPHE POLI ER 10-8 MG/5ML PO SUER
5.0000 mL | Freq: Once | ORAL | Status: AC
  Administered 2023-10-23: 5 mL via ORAL
  Filled 2023-10-23: qty 5

## 2023-10-23 MED ORDER — HYDROCOD POLI-CHLORPHE POLI ER 10-8 MG/5ML PO SUER: 5.0000 mL | Freq: Two times a day (BID) | ORAL | 0 refills | Status: AC | PRN

## 2023-10-23 MED ORDER — GUAIFENESIN ER 600 MG PO TB12: 600.0000 mg | ORAL_TABLET | Freq: Two times a day (BID) | ORAL | 1 refills | Status: AC | PRN

## 2023-10-23 NOTE — Progress Notes (Signed)
 MFM Consult Note  Kellie Padilla is currently at 24 weeks and 1 day.  She has been followed due to maternal obesity with a BMI of 37.    The views of the fetal anatomy were unable to be fully visualized during Kellie Padilla last exam.  She denies any problems since Kellie Padilla last exam.  On today's exam, the overall EFW of 1 pound 7 ounces measures at the 39th percentile for Kellie Padilla gestational age.  There was normal amniotic fluid noted.    The views of the fetal anatomy were visualized today.  There were no obvious anomalies noted.    The limitations of ultrasound in the detection of all anomalies was discussed.    Due to maternal obesity, a follow-up exam was scheduled in third trimester (in 10 weeks).    The patient stated that all of Kellie Padilla questions were answered today.  A total of 20 minutes was spent counseling and coordinating the care for this patient.  Greater than 50% of the time was spent in direct face-to-face contact.

## 2023-10-23 NOTE — MAU Provider Note (Signed)
 Chief Complaint:  Asthma   Event Date/Time   First Provider Initiated Contact with Patient 10/23/23 0215     HPI: Kellie Padilla is a 28 y.o. G1P0000 at 22w1dwho presents to maternity admissions reporting asthma exacerbation since Saturday.. States it started when I got sick. When questioned states she means congestion and coughing. States wheezing only gets worse if she vomits after coughing. States coughing stimulates vomiting.  Not feeling generally nauseated. . She reports good fetal movement, denies LOF, vaginal bleeding, h/a,  diarrhea, or fever/chills.    Asthma She complains of chest tightness, cough and wheezing. There is no difficulty breathing or shortness of breath. This is a new problem. The current episode started in the past 7 days. The problem has been unchanged. Pertinent negatives include no chest pain or fever. Her symptoms are not alleviated by beta-agonist. Her past medical history is significant for asthma.   RN Note:  Kellie Padilla is a 28 y.o. at [redacted]w[redacted]d here in MAU reporting asthma problems since Sat. She was using her Albuterol  which was helping initially. She has been coughing some and throwing up from coughing. She ran out of her Albuterol  and her asthma has gotten worse. Reports good FM and denies LOF or VB.  Onset of complaint: Sat       Pain score: 0      Past Medical History: Past Medical History:  Diagnosis Date   Allergy     Anxiety    Asthma    Irregular menstrual cycle     Past obstetric history: OB History  Gravida Para Term Preterm AB Living  1 0 0 0 0 0  SAB IAB Ectopic Multiple Live Births  0 0 0 0 0    # Outcome Date GA Lbr Len/2nd Weight Sex Type Anes PTL Lv  1 Current             Past Surgical History: Past Surgical History:  Procedure Laterality Date   NO PAST SURGERIES      Family History: Family History  Problem Relation Age of Onset   Healthy Mother    Healthy Father    Asthma Sister    Thyroid  disease Sister    Asthma  Brother    Asthma Maternal Aunt    Cancer Maternal Grandmother        breast   Cancer Maternal Grandfather        lung   Hypertension Paternal Grandmother    Diabetes Paternal Grandmother    Diabetes Paternal Grandfather    Heart disease Neg Hx     Social History: Social History   Tobacco Use   Smoking status: Never    Passive exposure: Yes   Smokeless tobacco: Never  Vaping Use   Vaping status: Never Used  Substance Use Topics   Alcohol use: Not Currently    Comment: socially   Drug use: No    Allergies:  Allergies  Allergen Reactions   Dust Mite Extract Anaphylaxis   Pollen Extract Anaphylaxis   Peanut Oil    Shellfish Allergy      Meds:  Medications Prior to Admission  Medication Sig Dispense Refill Last Dose/Taking   albuterol  (VENTOLIN  HFA) 108 (90 Base) MCG/ACT inhaler Inhale 2 puffs into the lungs every 6 (six) hours as needed. 18 g 0 10/23/2023 Morning   cetirizine  (ZYRTEC  ALLERGY ) 10 MG tablet Take 1 tablet (10 mg total) by mouth daily. 30 tablet 0 10/23/2023 Morning   albuterol  (VENTOLIN  HFA) 108 (90 Base) MCG/ACT inhaler Inhale  1-2 puffs into the lungs every 6 (six) hours as needed for wheezing or shortness of breath. 8 g 1    aspirin  EC 81 MG tablet Take 1 tablet (81 mg total) by mouth at bedtime. Start taking when you are [redacted] weeks pregnant for rest of pregnancy for prevention of preeclampsia (Patient not taking: Reported on 10/02/2023) 300 tablet 2    Blood Pressure Monitoring (BLOOD PRESSURE KIT) DEVI 1 Device by Does not apply route once a week. (Patient not taking: Reported on 10/02/2023) 1 each 0    cyclobenzaprine  (FLEXERIL ) 10 MG tablet Take 1 tablet (10 mg total) by mouth 2 (two) times daily as needed for muscle spasms. (Patient not taking: Reported on 10/02/2023) 40 tablet 0    Prenatal Vit-Fe Fumarate-FA (PRENATAL MULTIVITAMIN) TABS tablet Take 1 tablet by mouth daily at 12 noon. (Patient not taking: Reported on 10/02/2023)      Prenatal Vit-Fe Fumarate-FA  (PRENATAL VITAMIN) 27-0.8 MG TABS Take 1 tablet by mouth daily. 30 tablet 12     I have reviewed patient's Past Medical Hx, Surgical Hx, Family Hx, Social Hx, medications and allergies.   ROS:  Review of Systems  Constitutional:  Negative for fever.  Respiratory:  Positive for cough and wheezing. Negative for shortness of breath.   Cardiovascular:  Negative for chest pain.   Other systems negative  Physical Exam  Patient Vitals for the past 24 hrs:  BP Temp Pulse Resp SpO2 Height Weight  10/23/23 0117 118/76 97.7 F (36.5 C) (!) 113 20 100 % 5' 1 (1.549 m) 93 kg   Constitutional: Well-developed, well-nourished female in no acute distress.  Cardiovascular: normal rate and rhythm Respiratory: normal effort, clear to auscultation bilaterally no audible wheezes  Oxygen saturation 100% GI: Abd gravid appropriate for gestational age.   MS: Extremities nontender, no edema, normal ROM Neurologic: Alert and oriented x 4.    FHT:  Baseline 145 , moderate variability, accelerations present, no decelerations Contractions:  none   Labs: Respiratory Panel ordered Results for orders placed or performed during the hospital encounter of 10/23/23 (from the past 72 hours)  Resp panel by RT-PCR (RSV, Flu A&B, Covid) Anterior Nasal Swab     Status: None   Collection Time: 10/23/23  2:41 AM   Specimen: Anterior Nasal Swab  Result Value Ref Range   SARS Coronavirus 2 by RT PCR NEGATIVE NEGATIVE   Influenza A by PCR NEGATIVE NEGATIVE   Influenza B by PCR NEGATIVE NEGATIVE    Comment: (NOTE) The Xpert Xpress SARS-CoV-2/FLU/RSV plus assay is intended as an aid in the diagnosis of influenza from Nasopharyngeal swab specimens and should not be used as a sole basis for treatment. Nasal washings and aspirates are unacceptable for Xpert Xpress SARS-CoV-2/FLU/RSV testing.  Fact Sheet for Patients: BloggerCourse.com  Fact Sheet for Healthcare  Providers: SeriousBroker.it  This test is not yet approved or cleared by the [place]  FDA and has been authorized for detection and/or diagnosis of SARS-CoV-2 by FDA under an Emergency Use Authorization (EUA). This EUA will remain in effect (meaning this test can be used) for the duration of the COVID-19 declaration under Section 564(b)(1) of the Act, 21 U.S.C. section 360bbb-3(b)(1), unless the authorization is terminated or revoked.     Resp Syncytial Virus by PCR NEGATIVE NEGATIVE    Comment: (NOTE) Fact Sheet for Patients: BloggerCourse.com  Fact Sheet for Healthcare Providers: SeriousBroker.it  This test is not yet approved or cleared by the [place]  FDA and has been  authorized for detection and/or diagnosis of SARS-CoV-2 by FDA under an Emergency Use Authorization (EUA). This EUA will remain in effect (meaning this test can be used) for the duration of the COVID-19 declaration under Section 564(b)(1) of the Act, 21 U.S.C. section 360bbb-3(b)(1), unless the authorization is terminated or revoked.  Performed at [facility], [address], [place], [place] [address]      Imaging:  No results found.  MAU Course/MDM: I have reviewed the triage vital signs and the nursing notes.   Pertinent labs & imaging results that were available during my care of the patient were reviewed by me and considered in my medical decision making (see chart for details).      I have reviewed her medical records including past results, notes and treatments.   I have ordered labs and reviewed results.  NST reviewed Treatments in MAU included DuoNeb treatment.  Also gave Tussionex for cough  Assessment: Single IUP at [redacted]w[redacted]d Asthma exacerbation Upper Respiratory infection  Plan: Discharge home Rx Mucinex  for URI Rx Tussionex for cough. Has inhaler at home Preterm Labor precautions and fetal kick  counts Follow up in Office for prenatal visits and recheck Encouraged to return if she develops worsening of symptoms, increase in pain, fever, or other concerning symptoms.   Pt stable at time of discharge.  Earnie Pouch CNM, MSN Certified Nurse-Midwife 10/23/2023 2:15 AM

## 2023-10-23 NOTE — MAU Note (Signed)
 Kellie Padilla is a 28 y.o. at [redacted]w[redacted]d here in MAU reporting asthma problems since Sat. She was using her Albuterol  which was helping initially. She has been coughing some and throwing up from coughing. She ran out of her Albuterol  and her asthma has gotten worse. Reports good FM and denies LOF or VB.   LMP: n/a Onset of complaint: Sat Pain score: 0 Vitals:   10/23/23 0117  BP: 118/76  Pulse: (!) 113  Resp: 20  Temp: 97.7 F (36.5 C)  SpO2: 100%     FHT: 150  Lab orders placed from triage: none

## 2023-10-29 ENCOUNTER — Ambulatory Visit: Admitting: Licensed Clinical Social Worker

## 2023-10-29 DIAGNOSIS — F419 Anxiety disorder, unspecified: Secondary | ICD-10-CM | POA: Diagnosis not present

## 2023-10-29 DIAGNOSIS — O99340 Other mental disorders complicating pregnancy, unspecified trimester: Secondary | ICD-10-CM | POA: Diagnosis not present

## 2023-10-29 DIAGNOSIS — Z630 Problems in relationship with spouse or partner: Secondary | ICD-10-CM

## 2023-10-29 NOTE — BH Specialist Note (Signed)
 Integrated Behavioral Health via Telemedicine Visit  11/04/2023 Azriel Dancy [id number]  Number of Integrated Behavioral Health Clinician visits: 6-Sixth Visit  Session Start time: 0845   Session End time: 0946  Total time in minutes: 61    Referring Provider: Abigail Patient/Family location: At [facility] Provider location: Remote Office All persons participating in visit: Patient and [facility] Types of Service: Individual psychotherapy and Video visit  I connected with [name] and/or Waddell Brammer's patient via  Telephone or Video Enabled Telemedicine Application  (Video is Caregility application) and verified that I am speaking with the correct person using two identifiers. Discussed confidentiality: Yes   I discussed the limitations of telemedicine and the availability of in person appointments.  Discussed there is a possibility of technology failure and discussed alternative modes of communication if that failure occurs.  I discussed that engaging in this telemedicine visit, they consent to the provision of behavioral healthcare and the services will be billed under their insurance.  Patient and/or legal guardian expressed understanding and consented to Telemedicine visit: Yes   Presenting Concerns: Patient and/or family reports the following symptoms/concerns: Improvements with mood and anxiety symptoms. Duration of problem: Months; Severity of problem: moderate  Patient and/or Family's Strengths/Protective Factors: Social and Emotional competence, Concrete supports in place (healthy food, safe environments, etc.), Sense of purpose, Physical Health (exercise, healthy diet, medication compliance, etc.), and Caregiver has knowledge of parenting & child development  Goals Addressed: Patient will:  Reduce symptoms of: anxiety, depression, and stress   Increase knowledge and/or ability of: coping skills, healthy habits, self-management skills, and stress reduction   Demonstrate  ability to: Increase healthy adjustment to current life circumstances and Increase adequate support systems for patient/family  Progress towards Goals: Ongoing    Interventions: Interventions utilized:  Mindfulness or Management consultant, Supportive Counseling, Psychoeducation and/or Health Education, and Supportive Reflection Standardized Assessments completed: Not Needed    Patient and/or Family Response: The patient was present for today's virtual session. She reported that she has reconciled with her child's father and recently ended a relationship with a friend. She shared that her child's father was in a difficult situation and is currently residing with her for a 90-day period. The patient reported decreased stress, anxiety, and depressive symptoms. She stated that she has been socially engaged with friends, maintaining her academic responsibilities, and continuing to work full time. Additionally, she expressed excitement about her upcoming baby shower in [place]  and noted that she has created a gift registry. She also shared her anticipation for her birthday and an upcoming maternity photoshoot next month.   Clinical Assessment/Diagnosis  Anxiety during pregnancy  Partner relational problem    Assessment: Patient currently experiencing experiencing improved mood with decreased stress, anxiety, and depressive symptoms. She is maintaining social connections, managing her responsibilities effectively, and expressing positive anticipation about upcoming personal events..   Patient may benefit from continued support of integrated behavioral health services..  Plan: Follow up with behavioral health clinician on : 11/12/23 Behavioral recommendations:  Patient to include continuing to engage in social activities and maintain her support system, while monitoring mood symptoms to ensure they remain stable. Additionally, encouraging consistent self-care and stress management strategies will  support her ongoing mental wellness. Referral(s): Integrated [employer] (In Clinic)  I discussed the assessment and treatment plan with the patient and/or parent/guardian. They were provided an opportunity to ask questions and all were answered. They agreed with the plan and demonstrated an understanding of the instructions.  They were advised to call back or seek an in-person evaluation if the symptoms worsen or if the condition fails to improve as anticipated.  Sheri Prows LITTIE Seats, LCSWA

## 2023-11-04 ENCOUNTER — Ambulatory Visit: Admitting: Obstetrics & Gynecology

## 2023-11-04 VITALS — BP 105/68 | HR 83 | Wt 209.0 lb

## 2023-11-04 DIAGNOSIS — Z3480 Encounter for supervision of other normal pregnancy, unspecified trimester: Secondary | ICD-10-CM

## 2023-11-04 DIAGNOSIS — J4520 Mild intermittent asthma, uncomplicated: Secondary | ICD-10-CM

## 2023-11-04 DIAGNOSIS — O99212 Obesity complicating pregnancy, second trimester: Secondary | ICD-10-CM

## 2023-11-04 DIAGNOSIS — Z3A25 25 weeks gestation of pregnancy: Secondary | ICD-10-CM

## 2023-11-04 DIAGNOSIS — O99210 Obesity complicating pregnancy, unspecified trimester: Secondary | ICD-10-CM

## 2023-11-04 NOTE — Progress Notes (Signed)
Denies concerns today.

## 2023-11-04 NOTE — Progress Notes (Signed)
   PRENATAL VISIT NOTE  Subjective:  Kellie Padilla is a 28 y.o. G1P0000 at [redacted]w[redacted]d being seen today for ongoing prenatal care.  She is currently monitored for the following issues for this high-risk pregnancy and has Mild intermittent asthma without complication; Supervision of other normal pregnancy, antepartum; Routine general medical examination at a health care facility; Seasonal allergies; Obesity in pregnancy; Asthma during pregnancy; and Anxiety during pregnancy on their problem list.  Patient reports no complaints.  Contractions: Not present. Vag. Bleeding: None.  Movement: Present. Denies leaking of fluid.   The following portions of the patient's history were reviewed and updated as appropriate: allergies, current medications, past family history, past medical history, past social history, past surgical history and problem list.   Objective:    Vitals:   11/04/23 0817  BP: 105/68  Pulse: 83  Weight: 209 lb (94.8 kg)    Fetal Status:  Fetal Heart Rate (bpm): 145   Movement: Present    General: Alert, oriented and cooperative. Patient is in no acute distress.  Skin: Skin is warm and dry. No rash noted.   Cardiovascular: Normal heart rate noted  Respiratory: Normal respiratory effort, no problems with respiration noted  Abdomen: Soft, gravid, appropriate for gestational age.  Pain/Pressure: Absent     Pelvic: Cervical exam deferred        Extremities: Normal range of motion.  Edema: None  Mental Status: Normal mood and affect. Normal behavior. Normal judgment and thought content.   Assessment and Plan:  Pregnancy: G1P0000 at [redacted]w[redacted]d 1. Mild intermittent asthma without complication (Primary)   2. Supervision of other normal pregnancy, antepartum   3. Obesity in pregnancy 2 hr GTT next  Preterm labor symptoms and general obstetric precautions including but not limited to vaginal bleeding, contractions, leaking of fluid and fetal movement were reviewed in detail with the  patient. Please refer to After Visit Summary for other counseling recommendations.   Return in about 2 weeks (around 11/18/2023).  Future Appointments  Date Time Provider Department Center  11/12/2023  8:45 AM Sherrine Marcelyn LITTIE ISRAEL [facility] None  01/01/2024  9:15 AM [facility] PROVIDER 1 [facility]  01/01/2024  9:30 AM [facility] US3 [facility]    Lynwood Solomons, MD

## 2023-11-12 ENCOUNTER — Ambulatory Visit: Admitting: Licensed Clinical Social Worker

## 2023-11-21 ENCOUNTER — Ambulatory Visit: Payer: Self-pay | Admitting: Physician Assistant

## 2023-11-21 ENCOUNTER — Other Ambulatory Visit

## 2023-11-21 VITALS — BP 113/75 | HR 89 | Wt 208.5 lb

## 2023-11-21 DIAGNOSIS — J4520 Mild intermittent asthma, uncomplicated: Secondary | ICD-10-CM | POA: Diagnosis not present

## 2023-11-21 DIAGNOSIS — Z3A28 28 weeks gestation of pregnancy: Secondary | ICD-10-CM

## 2023-11-21 DIAGNOSIS — Z3480 Encounter for supervision of other normal pregnancy, unspecified trimester: Secondary | ICD-10-CM | POA: Diagnosis not present

## 2023-11-21 DIAGNOSIS — Z6837 Body mass index (BMI) 37.0-37.9, adult: Secondary | ICD-10-CM

## 2023-11-21 MED ORDER — PRENATAL MULTIVITAMIN CH
1.0000 | ORAL_TABLET | Freq: Every day | ORAL | 2 refills | Status: AC
Start: 2023-11-21 — End: ?

## 2023-11-21 NOTE — Progress Notes (Signed)
 Pt presents for rob. Pt is doing her gtt today. Pt needs refill on prenatals. No other questions or concerns at this time.

## 2023-11-21 NOTE — BH Specialist Note (Signed)
 Patient no showed for today's visit.

## 2023-11-21 NOTE — Progress Notes (Addendum)
   PRENATAL VISIT NOTE  Subjective:  Kaprice Kage is a 28 y.o. G1P0000 at [redacted]w[redacted]d being seen today for ongoing prenatal care.  She is currently monitored for the following issues for this low-risk pregnancy and has Mild intermittent asthma without complication; Supervision of other normal pregnancy, antepartum; Routine general medical examination at a health care facility; Seasonal allergies; Obesity in pregnancy; Asthma during pregnancy; and Anxiety during pregnancy on their problem list.  Patient reports no complaints.  Contractions: Not present. Vag. Bleeding: None.  Movement: Present. Denies leaking of fluid.   The following portions of the patient's history were reviewed and updated as appropriate: allergies, current medications, past family history, past medical history, past social history, past surgical history and problem list.   Objective:    Vitals:   11/21/23 0822  BP: 113/75  Pulse: 89  Weight: 208 lb 8 oz (94.6 kg)    Fetal Status:  Fetal Heart Rate (bpm): 145 Fundal Height: 27 cm Movement: Present    General: Alert, oriented and cooperative. Patient is in no acute distress.  Skin: Skin is warm and dry. No rash noted.   Cardiovascular: Normal heart rate noted  Respiratory: Normal respiratory effort, no problems with respiration noted  Abdomen: Soft, gravid, appropriate for gestational age.  Pain/Pressure: Absent     Pelvic: Cervical exam deferred        Extremities: Normal range of motion.  Edema: None  Mental Status: Normal mood and affect. Normal behavior. Normal judgment and thought content.   Assessment and Plan:  Pregnancy: G1P0000 at [redacted]w[redacted]d  1. Supervision of other normal pregnancy, antepartum (Primary) Patient doing well, feeling regular fetal movement  BP, FHR, FH appropriate Patient moving home to [place] after f/u growth scan  2. [redacted] weeks gestation of pregnancy Anticipatory guidance about next visits/weeks of pregnancy given.   3. Mild intermittent asthma  without complication Stable  4. BMI 37.0-37.9, adult Continue ASA 01/01/24 f/u US    Preterm labor symptoms and general obstetric precautions including but not limited to vaginal bleeding, contractions, leaking of fluid and fetal movement were reviewed in detail with the patient.  Please refer to After Visit Summary for other counseling recommendations.   Return in about 2 weeks (around 12/05/2023) for LOB.  Future Appointments  Date Time Provider Department Center  11/21/2023  9:15 AM Chales Pelissier E, PA-C [facility] None  01/01/2024  9:15 AM [facility] PROVIDER 1 [facility]  01/01/2024  9:30 AM [facility] US3 [facility]    Jorene FORBES Moats, PA-C

## 2023-11-22 LAB — CBC
Hematocrit: 37.4 % (ref 34.0–46.6)
Hemoglobin: 12 g/dL (ref 11.1–15.9)
MCH: 28.7 pg (ref 26.6–33.0)
MCHC: 32.1 g/dL (ref 31.5–35.7)
MCV: 90 fL (ref 79–97)
Platelets: 181 x10E3/uL (ref 150–450)
RBC: 4.18 x10E6/uL (ref 3.77–5.28)
RDW: 13.5 % (ref 11.7–15.4)
WBC: 6.6 x10E3/uL (ref 3.4–10.8)

## 2023-11-22 LAB — HIV ANTIBODY (ROUTINE TESTING W REFLEX): HIV Screen 4th Generation wRfx: NONREACTIVE

## 2023-11-22 LAB — RPR: RPR Ser Ql: NONREACTIVE

## 2023-11-22 LAB — GLUCOSE TOLERANCE, 2 HOURS W/ 1HR
Glucose, 1 hour: 148 mg/dL (ref 70–179)
Glucose, 2 hour: 111 mg/dL (ref 70–152)
Glucose, Fasting: 83 mg/dL (ref 70–91)

## 2023-11-26 ENCOUNTER — Ambulatory Visit: Payer: Self-pay | Admitting: Physician Assistant

## 2023-12-06 ENCOUNTER — Ambulatory Visit: Admitting: Physician Assistant

## 2023-12-06 ENCOUNTER — Encounter: Payer: Self-pay | Admitting: Physician Assistant

## 2023-12-06 VITALS — BP 110/70 | HR 93 | Wt 211.0 lb

## 2023-12-06 DIAGNOSIS — Z3480 Encounter for supervision of other normal pregnancy, unspecified trimester: Secondary | ICD-10-CM

## 2023-12-06 DIAGNOSIS — Z6837 Body mass index (BMI) 37.0-37.9, adult: Secondary | ICD-10-CM

## 2023-12-06 DIAGNOSIS — Z3A30 30 weeks gestation of pregnancy: Secondary | ICD-10-CM | POA: Diagnosis not present

## 2023-12-06 NOTE — Progress Notes (Signed)
   PRENATAL VISIT NOTE  Subjective:  Kellie Padilla is a 28 y.o. G1P0000 at [redacted]w[redacted]d being seen today for ongoing prenatal care.  She is currently monitored for the following issues for this low-risk pregnancy and has Mild intermittent asthma without complication; Supervision of other normal pregnancy, antepartum; Routine general medical examination at a health care facility; Seasonal allergies; Obesity in pregnancy; Asthma during pregnancy; and Anxiety during pregnancy on their problem list.  Patient reports no complaints.  Contractions: Not present. Vag. Bleeding: None.  Movement: Present. Denies leaking of fluid.   The following portions of the patient's history were reviewed and updated as appropriate: allergies, current medications, past family history, past medical history, past social history, past surgical history and problem list.   Objective:    Vitals:   12/06/23 1006  BP: 110/70  Pulse: 93  Weight: 211 lb (95.7 kg)    Fetal Status:  Fetal Heart Rate (bpm): 154 Fundal Height: 31 cm Movement: Present    General: Alert, oriented and cooperative. Patient is in no acute distress.  Skin: Skin is warm and dry. No rash noted.   Cardiovascular: Normal heart rate noted  Respiratory: Normal respiratory effort, no problems with respiration noted  Abdomen: Soft, gravid, appropriate for gestational age.  Pain/Pressure: Present     Pelvic: Cervical exam deferred        Extremities: Normal range of motion.  Edema: None  Mental Status: Normal mood and affect. Normal behavior. Normal judgment and thought content.   Assessment and Plan:  Pregnancy: G1P0000 at [redacted]w[redacted]d  1. Supervision of other normal pregnancy, antepartum (Primary) Patient doing well, feeling regular fetal movement  BP, FHR, FH appropriate   2. [redacted] weeks gestation of pregnancy Anticipatory guidance about next visits/weeks of pregnancy given.   3. BMI 37.0-37.9, adult 12/17/23 f/u US   Preterm labor symptoms and general  obstetric precautions including but not limited to vaginal bleeding, contractions, leaking of fluid and fetal movement were reviewed in detail with the patient.  Please refer to After Visit Summary for other counseling recommendations.   Return in about 2 weeks (around 12/20/2023) for LOB.  Future Appointments  Date Time Provider Department Center  12/17/2023 11:15 AM [facility] PROVIDER 1 [facility]  12/17/2023 11:30 AM [facility] US6 [facility]    Jorene FORBES Moats, PA-C

## 2023-12-06 NOTE — Progress Notes (Signed)
 Pt presents for ROB visit. No concerns

## 2023-12-10 ENCOUNTER — Inpatient Hospital Stay (HOSPITAL_COMMUNITY)
Admission: AD | Admit: 2023-12-10 | Discharge: 2023-12-10 | Disposition: A | Discharge status: Home / Self Care | Attending: Obstetrics and Gynecology | Admitting: Obstetrics and Gynecology

## 2023-12-10 ENCOUNTER — Encounter (HOSPITAL_COMMUNITY): Payer: Self-pay | Admitting: Obstetrics and Gynecology

## 2023-12-10 ENCOUNTER — Inpatient Hospital Stay (HOSPITAL_BASED_OUTPATIENT_CLINIC_OR_DEPARTMENT_OTHER)

## 2023-12-10 ENCOUNTER — Other Ambulatory Visit: Payer: Self-pay

## 2023-12-10 DIAGNOSIS — O99333 Smoking (tobacco) complicating pregnancy, third trimester: Secondary | ICD-10-CM | POA: Insufficient documentation

## 2023-12-10 DIAGNOSIS — E669 Obesity, unspecified: Secondary | ICD-10-CM

## 2023-12-10 DIAGNOSIS — W19XXXA Unspecified fall, initial encounter: Secondary | ICD-10-CM

## 2023-12-10 DIAGNOSIS — O24410 Gestational diabetes mellitus in pregnancy, diet controlled: Secondary | ICD-10-CM | POA: Diagnosis not present

## 2023-12-10 DIAGNOSIS — Z3A31 31 weeks gestation of pregnancy: Secondary | ICD-10-CM | POA: Diagnosis not present

## 2023-12-10 DIAGNOSIS — W010XXA Fall on same level from slipping, tripping and stumbling without subsequent striking against object, initial encounter: Secondary | ICD-10-CM | POA: Diagnosis not present

## 2023-12-10 DIAGNOSIS — O99213 Obesity complicating pregnancy, third trimester: Secondary | ICD-10-CM

## 2023-12-10 DIAGNOSIS — O9A213 Injury, poisoning and certain other consequences of external causes complicating pregnancy, third trimester: Secondary | ICD-10-CM

## 2023-12-10 DIAGNOSIS — Z3480 Encounter for supervision of other normal pregnancy, unspecified trimester: Secondary | ICD-10-CM

## 2023-12-10 DIAGNOSIS — J45909 Unspecified asthma, uncomplicated: Secondary | ICD-10-CM | POA: Diagnosis not present

## 2023-12-10 DIAGNOSIS — O99513 Diseases of the respiratory system complicating pregnancy, third trimester: Secondary | ICD-10-CM | POA: Diagnosis not present

## 2023-12-10 DIAGNOSIS — Z3A35 35 weeks gestation of pregnancy: Secondary | ICD-10-CM

## 2023-12-10 LAB — CBC
HCT: 37 % (ref 36.0–46.0)
Hemoglobin: 12 g/dL (ref 12.0–15.0)
MCH: 28.4 pg (ref 26.0–34.0)
MCHC: 32.4 g/dL (ref 30.0–36.0)
MCV: 87.5 fL (ref 80.0–100.0)
Platelets: 171 K/uL (ref 150–400)
RBC: 4.23 MIL/uL (ref 3.87–5.11)
RDW: 14.3 % (ref 11.5–15.5)
WBC: 7.5 K/uL (ref 4.0–10.5)
nRBC: 0 % (ref 0.0–0.2)

## 2023-12-10 LAB — APTT: aPTT: 34 s (ref 24–36)

## 2023-12-10 LAB — FIBRINOGEN: Fibrinogen: 507 mg/dL — ABNORMAL HIGH (ref 210–475)

## 2023-12-10 LAB — PROTIME-INR
INR: 1.1 (ref 0.8–1.2)
Prothrombin Time: 14.3 s (ref 11.4–15.2)

## 2023-12-10 NOTE — Discharge Instructions (Signed)
 Kellie Padilla,  You came into the MAU (Maternity Assessment Unit) today after a fall and hitting your stomach. We did some lab work for you today, which was all within normal limits for you being in your third trimester of pregnancy. We also did an ultrasound to take a look at your baby and your placenta, both of which looked fine; we are still waiting on the official read for the ultrasound, and will call you if that comes back abnormal. We monitored your baby's heart beat and it was good during your stay.  Reasons to return to the MAU: - You start having abdominal pain or contractions (squeezing sensation) - You have vaginal bleeding or start leaking fluid - You feel your baby moving less or not moving - You develop a fever  Thank you for allowing us  to participate in your care! Dr. Alan Flies

## 2023-12-10 NOTE — MAU Note (Signed)
 Kellie Padilla is a 28 y.o. at [redacted]w[redacted]d here in MAU reporting: she fell last night @ 2330.  Reports she fell face forward and struck her abdomen.  Denies VB or LOF.  Endorses +FM, states he's still moving like his regular self.  LMP: 04/21/2023 Onset of complaint: yesterday Pain score: 6 Vitals:   12/10/23 0840  BP: 116/65  Pulse: 96  Resp: 18  Temp: 98.3 F (36.8 C)  SpO2: 100%     FHT: 167 bpm  Lab orders placed from triage: None

## 2023-12-10 NOTE — MAU Provider Note (Signed)
 Chief Complaint:  Fall   HPI   Patient reports last night 9/1 at 11:30pm, she tripped over toe and fell; hit toe, knee, stomach (central abdomen). No syncope, no presyncope, no dizziness. No sudden pain in abdomen after.  Still reports some pressure in epigastric region; going on before fall (had been on feet for 8 hours at work yesterday). Baby still moving well (no changes since fall), no fluid leakage, no vaginal bleeding, no contractions. No other reported symptoms.  Pregnancy Course: Receives care at [facility] for [facility] . Prenatal records reviewed. Rubella nonimmune. Normal fetal US  thus far; repeat scheduled for 12/17/23 given maternal obesity.  Past Medical History:  Diagnosis Date   Allergy     Anxiety    Asthma    last ued inhaler 1-87mons aggo   Irregular menstrual cycle    OB History  Gravida Para Term Preterm AB Living  1 0 0 0 0 0  SAB IAB Ectopic Multiple Live Births  0 0 0 0 0    # Outcome Date GA Lbr Len/2nd Weight Sex Type Anes PTL Lv  1 Current            Past Surgical History:  Procedure Laterality Date   NO PAST SURGERIES     Family History  Problem Relation Age of Onset   Healthy Mother    Healthy Father    Asthma Sister    Thyroid  disease Sister    Asthma Brother    Asthma Maternal Aunt    Cancer Maternal Grandmother        breast   Cancer Maternal Grandfather        lung   Hypertension Paternal Grandmother    Diabetes Paternal Grandmother    Diabetes Paternal Grandfather    Heart disease Neg Hx    Social History   Tobacco Use   Smoking status: Never    Passive exposure: Yes   Smokeless tobacco: Never  Vaping Use   Vaping status: Never Used  Substance Use Topics   Alcohol use: Not Currently    Comment: socially   Drug use: No   Allergies  Allergen Reactions   Dust Mite Extract Anaphylaxis   Pollen Extract Anaphylaxis   Peanut Oil    Shellfish Allergy     Medications Prior to Admission  Medication Sig Dispense  Refill Last Dose/Taking   Prenatal Vit-Fe Fumarate-FA (PRENATAL MULTIVITAMIN) TABS tablet Take 1 tablet by mouth daily at 12 noon. 90 tablet 2 12/09/2023 Noon   albuterol  (VENTOLIN  HFA) 108 (90 Base) MCG/ACT inhaler Inhale 2 puffs into the lungs every 6 (six) hours as needed. 18 g 0 More than a month   albuterol  (VENTOLIN  HFA) 108 (90 Base) MCG/ACT inhaler Inhale 1-2 puffs into the lungs every 6 (six) hours as needed for wheezing or shortness of breath. 8 g 1 More than a month   aspirin  EC 81 MG tablet Take 1 tablet (81 mg total) by mouth at bedtime. Start taking when you are [redacted] weeks pregnant for rest of pregnancy for prevention of preeclampsia (Patient not taking: Reported on 10/02/2023) 300 tablet 2    Blood Pressure Monitoring (BLOOD PRESSURE KIT) DEVI 1 Device by Does not apply route once a week. 1 each 0    cetirizine  (ZYRTEC  ALLERGY ) 10 MG tablet Take 1 tablet (10 mg total) by mouth daily. 30 tablet 0 More than a month   chlorpheniramine-HYDROcodone (TUSSIONEX) 10-8 MG/5ML Take 5 mLs by mouth every 12 (twelve) hours as needed for cough. (  Patient not taking: Reported on 11/04/2023) 30 mL 0    cyclobenzaprine  (FLEXERIL ) 10 MG tablet Take 1 tablet (10 mg total) by mouth 2 (two) times daily as needed for muscle spasms. (Patient not taking: Reported on 11/04/2023) 40 tablet 0    guaiFENesin  (MUCINEX ) 600 MG 12 hr tablet Take 1 tablet (600 mg total) by mouth 2 (two) times daily as needed. (Patient not taking: Reported on 11/04/2023) 30 tablet 1    Prenatal Vit-Fe Fumarate-FA (PRENATAL VITAMIN) 27-0.8 MG TABS Take 1 tablet by mouth daily. (Patient not taking: Reported on 11/04/2023) 30 tablet 12     I have reviewed patient's Past Medical Hx, Surgical Hx, Family Hx, Social Hx, medications and allergies.   ROS  Pertinent items noted in HPI and remainder of comprehensive ROS otherwise negative.   PHYSICAL EXAM  Patient Vitals for the past 24 hrs:  BP Temp Temp src Pulse Resp SpO2 Height Weight  12/10/23  1118 134/65 -- -- 87 -- -- -- --  12/10/23 0855 117/62 -- -- 95 -- 99 % -- --  12/10/23 0840 116/65 98.3 F (36.8 C) Oral 96 18 100 % -- --  12/10/23 0833 -- -- -- -- -- -- 5' (1.524 m) 94.8 kg    Constitutional: Well-developed, well-nourished female in no acute distress.  HEENT: atraumatic, normocephalic. Neck has normal ROM. EOM intact. Cardiovascular: normal rate & rhythm, warm and well-perfused Respiratory: normal effort, no problems with respiration noted GI: Abd soft, non-tender. MSK: Extremities nontender, no edema, normal ROM.  Skin: warm and dry. Acyanotic, no jaundice or pallor. No bruising of abdomen. Small cut on right 2nd toe, no erythema/edema/drainage. Neurologic: Alert and oriented x 4. No abnormal coordination. Psychiatric: Normal mood. Speech not slurred, not rapid/pressured. Patient is cooperative.      Fetal Tracing: Baseline FHR: 148 per minute Fetal heart variability: moderate Fetal Heart Rate accelerations: yes Fetal Heart Rate decelerations: none Fetal Non-stress Test: Category I (reactive) Toco:  Cat I (baseline 110-160 bpm, moderate FHR 6-25 bpm, no late or variable decelerations, may have accelerations or early decelerations) Cat III (no variability, baseline <110 bpm, recurrent late decelerations, recurrent variable decelerations, sinusoidal pattern) Cat II (indeterminate)  Labs: Results for orders placed or performed during the hospital encounter of 12/10/23 (from the past 24 hours)  CBC     Status: None   Collection Time: 12/10/23  9:45 AM  Result Value Ref Range   WBC 7.5 4.0 - 10.5 K/uL   RBC 4.23 3.87 - 5.11 MIL/uL   Hemoglobin 12.0 12.0 - 15.0 g/dL   HCT 62.9 63.9 - 53.9 %   MCV 87.5 80.0 - 100.0 fL   MCH 28.4 26.0 - 34.0 pg   MCHC 32.4 30.0 - 36.0 g/dL   RDW 85.6 88.4 - 84.4 %   Platelets 171 150 - 400 K/uL   nRBC 0.0 0.0 - 0.2 %  Fibrinogen  (coagulopathy lab panel)     Status: Abnormal   Collection Time: 12/10/23  9:45 AM  Result  Value Ref Range   Fibrinogen  507 (H) 210 - 475 mg/dL  Protime-INR (coagulopathy lab panel)     Status: None   Collection Time: 12/10/23  9:45 AM  Result Value Ref Range   Prothrombin Time 14.3 11.4 - 15.2 seconds   INR 1.1 0.8 - 1.2  APTT (coagulopathy lab panel)     Status: None   Collection Time: 12/10/23  9:45 AM  Result Value Ref Range   aPTT 34 24 - 36  seconds    Imaging:  No results found. OB ultrasound pending  MDM & MAU COURSE  MDM: Straightforward  MAU Course: Differential diagnosis considered for fall including mechanical fall (considered most likely given patient's report of tripping over own foot), presyncope/syncope (denied by patient), anemia (recent CBC 8/14 with Hgb of 12), arrhythmia (no abnormal physical exam findings). Reassuringly, FHR tracing of Category I throughout evaluation period. No contractions seen on monitor.  Evaluated with CBC, PTT, INR, fibrinogen ; ultrasound to visualize placenta. Labwork without abnormal findings for patient's current gestational age. Ultrasound without concerning findings; official read pending at time of patient discharge, but personally reviewed images while patient present.   Orders Placed This Encounter  Procedures   US  MFM OB LIMITED   CBC   Fibrinogen  (coagulopathy lab panel)   Protime-INR (coagulopathy lab panel)   APTT (coagulopathy lab panel)   Discharge patient    ASSESSMENT   1. Fall, initial encounter   2. Supervision of other normal pregnancy, antepartum   3. [redacted] weeks gestation of pregnancy     PLAN  Discharge home in stable condition with return precautions discussed (cramping, abdominal pain, vaginal bleeding, decreased/absent fetal movement).  Will continue to follow OB ultrasound with plan to get in contact with patient if this returns abnormal; patient is aware of this and agreeable to plan.    Allergies as of 12/10/2023       Reactions   Dust Mite Extract Anaphylaxis   Pollen Extract Anaphylaxis    Peanut Oil    Shellfish Allergy          Medication List     TAKE these medications    albuterol  108 (90 Base) MCG/ACT inhaler Commonly known as: VENTOLIN  HFA Inhale 2 puffs into the lungs every 6 (six) hours as needed.   albuterol  108 (90 Base) MCG/ACT inhaler Commonly known as: VENTOLIN  HFA Inhale 1-2 puffs into the lungs every 6 (six) hours as needed for wheezing or shortness of breath.   aspirin  EC 81 MG tablet Take 1 tablet (81 mg total) by mouth at bedtime. Start taking when you are [redacted] weeks pregnant for rest of pregnancy for prevention of preeclampsia   Blood Pressure Kit Devi 1 Device by Does not apply route once a week.   cetirizine  10 MG tablet Commonly known as: ZyrTEC  Allergy  Take 1 tablet (10 mg total) by mouth daily.   chlorpheniramine-HYDROcodone 10-8 MG/5ML Commonly known as: TUSSIONEX Take 5 mLs by mouth every 12 (twelve) hours as needed for cough.   cyclobenzaprine  10 MG tablet Commonly known as: FLEXERIL  Take 1 tablet (10 mg total) by mouth 2 (two) times daily as needed for muscle spasms.   guaiFENesin  600 MG 12 hr tablet Commonly known as: Mucinex  Take 1 tablet (600 mg total) by mouth 2 (two) times daily as needed.   prenatal multivitamin Tabs tablet Take 1 tablet by mouth daily at 12 noon.   Prenatal Vitamin 27-0.8 MG Tabs Take 1 tablet by mouth daily.        Alan Flies, MD

## 2023-12-12 ENCOUNTER — Ambulatory Visit: Admitting: Physician Assistant

## 2023-12-17 ENCOUNTER — Ambulatory Visit: Attending: Obstetrics and Gynecology | Admitting: Obstetrics and Gynecology

## 2023-12-17 ENCOUNTER — Ambulatory Visit

## 2023-12-17 DIAGNOSIS — O99213 Obesity complicating pregnancy, third trimester: Secondary | ICD-10-CM | POA: Diagnosis not present

## 2023-12-17 DIAGNOSIS — X58XXXS Exposure to other specified factors, sequela: Secondary | ICD-10-CM | POA: Insufficient documentation

## 2023-12-17 DIAGNOSIS — O99891 Other specified diseases and conditions complicating pregnancy: Secondary | ICD-10-CM | POA: Insufficient documentation

## 2023-12-17 DIAGNOSIS — J45909 Unspecified asthma, uncomplicated: Secondary | ICD-10-CM

## 2023-12-17 DIAGNOSIS — O99210 Obesity complicating pregnancy, unspecified trimester: Secondary | ICD-10-CM | POA: Diagnosis present

## 2023-12-17 DIAGNOSIS — E669 Obesity, unspecified: Secondary | ICD-10-CM

## 2023-12-17 DIAGNOSIS — O4693 Antepartum hemorrhage, unspecified, third trimester: Secondary | ICD-10-CM

## 2023-12-17 DIAGNOSIS — T1490XS Injury, unspecified, sequela: Secondary | ICD-10-CM | POA: Insufficient documentation

## 2023-12-17 DIAGNOSIS — O9A219 Injury, poisoning and certain other consequences of external causes complicating pregnancy, unspecified trimester: Secondary | ICD-10-CM | POA: Diagnosis not present

## 2023-12-17 DIAGNOSIS — Z3A32 32 weeks gestation of pregnancy: Secondary | ICD-10-CM

## 2023-12-17 DIAGNOSIS — E6689 Other obesity not elsewhere classified: Secondary | ICD-10-CM

## 2023-12-17 DIAGNOSIS — Z362 Encounter for other antenatal screening follow-up: Secondary | ICD-10-CM | POA: Diagnosis not present

## 2023-12-17 DIAGNOSIS — O99513 Diseases of the respiratory system complicating pregnancy, third trimester: Secondary | ICD-10-CM

## 2023-12-17 NOTE — Progress Notes (Signed)
 Maternal-Fetal Medicine Consultation  Name: Kellie Padilla  MRN: [id number]  GA: G1P0000 [redacted]w[redacted]d   Pregravid BMI 37.  Patient returned for fetal growth assessment.  She does not have gestational diabetes.  Blood pressure today at our office is 106/76 mmHg. Last week she was evaluated at the maternity admissions unit for complaints of vaginal bleeding.  Patient reports she did not have any vaginal bleeding since then.  On today's ultrasound, amniotic fluid is slightly decreased for this gestational age (AFI 7.5 cm).  Cephalic presentation.  Fetal growth is appropriate for gestational age. Placenta appears normal and there is no evidence of placental abruption.  I reassured the patient of normal appearing placenta and there is no evidence of abruption.  Ultrasound, however, has limitations in diagnosing abruption. I discussed the finding of amniotic fluid that is decreased for this gestational age.  I encouraged her to increase oral hydration.  Patient reports she will be moving to [place]  and will be having her prenatal care and delivery there.  I recommended a follow-up ultrasound in 4 to 5 weeks to check fetal growth and amniotic fluid.   Recommendations - No follow-up appointments were made.  Consultation including face-to-face (more than 50%) counseling 20 minutes.

## 2023-12-23 ENCOUNTER — Encounter: Admitting: Advanced Practice Midwife

## 2024-01-01 ENCOUNTER — Ambulatory Visit
